# Patient Record
Sex: Female | Born: 1945 | Race: Black or African American | Hispanic: No | Smoking: Former smoker
Health system: Southern US, Community
[De-identification: ages and names within clinical notes are randomized; demographics above are authoritative.]

## PROBLEM LIST (undated history)

## (undated) DIAGNOSIS — E119 Type 2 diabetes mellitus without complications: Secondary | ICD-10-CM

## (undated) DIAGNOSIS — E785 Hyperlipidemia, unspecified: Secondary | ICD-10-CM

## (undated) DIAGNOSIS — Z789 Other specified health status: Secondary | ICD-10-CM

## (undated) DIAGNOSIS — I10 Essential (primary) hypertension: Secondary | ICD-10-CM

## (undated) DIAGNOSIS — M1990 Unspecified osteoarthritis, unspecified site: Secondary | ICD-10-CM

## (undated) DIAGNOSIS — H269 Unspecified cataract: Secondary | ICD-10-CM

## (undated) DIAGNOSIS — Z15068 Genetic susceptibility to other malignant neoplasm of digestive system: Secondary | ICD-10-CM

## (undated) DIAGNOSIS — T7840XA Allergy, unspecified, initial encounter: Secondary | ICD-10-CM

## (undated) DIAGNOSIS — C50919 Malignant neoplasm of unspecified site of unspecified female breast: Secondary | ICD-10-CM

## (undated) HISTORY — DX: Allergy, unspecified, initial encounter: T78.40XA

## (undated) HISTORY — DX: Unspecified osteoarthritis, unspecified site: M19.90

## (undated) HISTORY — DX: Malignant neoplasm of unspecified site of unspecified female breast: C50.919

## (undated) HISTORY — DX: Essential (primary) hypertension: I10

## (undated) HISTORY — DX: Hyperlipidemia, unspecified: E78.5

## (undated) HISTORY — PX: BREAST SURGERY: SHX581

## (undated) HISTORY — DX: Type 2 diabetes mellitus without complications: E11.9

## (undated) HISTORY — DX: Unspecified cataract: H26.9

## (undated) HISTORY — PX: ABDOMINAL HYSTERECTOMY: SHX81

## (undated) HISTORY — DX: Genetic susceptibility to other malignant neoplasm of digestive system: Z15.068

---

## 2004-12-15 ENCOUNTER — Emergency Department: Payer: Self-pay | Admitting: Family Medicine

## 2016-11-06 ENCOUNTER — Emergency Department: Payer: Medicare Other

## 2016-11-06 ENCOUNTER — Encounter: Payer: Self-pay | Admitting: Emergency Medicine

## 2016-11-06 ENCOUNTER — Emergency Department
Admission: EM | Admit: 2016-11-06 | Discharge: 2016-11-06 | Disposition: A | Discharge status: Home / Self Care | Payer: Medicare Other | Attending: Emergency Medicine | Admitting: Emergency Medicine

## 2016-11-06 DIAGNOSIS — Z87891 Personal history of nicotine dependence: Secondary | ICD-10-CM | POA: Insufficient documentation

## 2016-11-06 DIAGNOSIS — I10 Essential (primary) hypertension: Secondary | ICD-10-CM | POA: Diagnosis not present

## 2016-11-06 DIAGNOSIS — R079 Chest pain, unspecified: Secondary | ICD-10-CM | POA: Diagnosis present

## 2016-11-06 HISTORY — DX: Other specified health status: Z78.9

## 2016-11-06 LAB — BASIC METABOLIC PANEL
ANION GAP: 4 — AB (ref 5–15)
BUN: 9 mg/dL (ref 6–20)
CALCIUM: 9.3 mg/dL (ref 8.9–10.3)
CHLORIDE: 104 mmol/L (ref 101–111)
CO2: 29 mmol/L (ref 22–32)
Creatinine, Ser: 0.7 mg/dL (ref 0.44–1.00)
GFR calc Af Amer: >60 mL/min (ref >60)
GFR calc non Af Amer: >60 mL/min (ref >60)
Glucose, Bld: 164 mg/dL — ABNORMAL HIGH (ref 65–99)
POTASSIUM: 3.9 mmol/L (ref 3.5–5.1)
Sodium: 137 mmol/L (ref 135–145)

## 2016-11-06 LAB — CBC
HCT: 36 % (ref 35.0–47.0)
HEMOGLOBIN: 12.2 g/dL (ref 12.0–16.0)
MCH: 31.7 pg (ref 26.0–34.0)
MCHC: 33.9 g/dL (ref 32.0–36.0)
MCV: 93.3 fL (ref 80.0–100.0)
Platelets: 263 10*3/uL (ref 150–440)
RBC: 3.86 MIL/uL (ref 3.80–5.20)
RDW: 14.1 % (ref 11.5–14.5)
WBC: 8.4 10*3/uL (ref 3.6–11.0)

## 2016-11-06 LAB — TROPONIN I: Troponin I: <0.03 ng/mL (ref <0.03)

## 2016-11-06 MED ORDER — OMEPRAZOLE 40 MG PO CPDR: 40.0000 mg | DELAYED_RELEASE_CAPSULE | Freq: Every day | ORAL | 0 refills | Status: DC

## 2016-11-06 MED ORDER — METOPROLOL TARTRATE 25 MG PO TABS
25.0000 mg | ORAL_TABLET | Freq: Once | ORAL | Status: AC
  Administered 2016-11-06: 25 mg via ORAL
  Filled 2016-11-06: qty 1

## 2016-11-06 NOTE — ED Provider Notes (Signed)
[facility] Emergency Department Provider Note  ____________________________________________  Time seen: Approximately 5:00 PM  I have reviewed the triage vital signs and the nursing notes.   HISTORY  Chief Complaint Chest Pain    HPI Vicki West is a 71 y.o. female , otherwise healthy, presenting with right-sided chest pain. The patient reports that yesterday in the day before, she had some episodes of sharp chest pains below the right breast. She noticed that it was worse if she made a quick lateral movement to the right with her arm. She treated it with medication for "heartburn" and this helped; she does not remember which medication she used. Today, the patient has not had any symptoms "because I took the heartburn medicine." She denies any pleuritic component, palpitations, shortness of breath, diaphoresis, nausea or vomiting, radiation of pain, lower extremity swelling or calf pain. The patient has not seen a primary care physician for decades, and has never been told she is hypertensive although her blood pressure here is 191/93.At this time, the patient is completely symptomatic.   Past Medical History:  Diagnosis Date  . Patient denies medical problems     There are no active problems to display for this patient.   Past Surgical History:  Procedure Laterality Date  . ABDOMINAL HYSTERECTOMY        Allergies Patient has no known allergies.  History reviewed. No pertinent family history.  Social History Social History  Substance Use Topics  . Smoking status: Former Games developer  . Smokeless tobacco: Never Used  . Alcohol use Yes    Review of Systems Constitutional: No fever/chills.No lightheadedness or syncope. No diaphoresis. Eyes: No visual changes. ENT: No sore throat. No congestion or rhinorrhea. Cardiovascular: Positive right-sided chest pain. Denies palpitations. Respiratory: Denies shortness of breath.  No  cough. Gastrointestinal: No abdominal pain.  No nausea, no vomiting.  No diarrhea.  No constipation. Genitourinary: Negative for dysuria. Musculoskeletal: Negative for back pain. No lower extremity swelling. Her pain. Skin: Negative for rash. Neurological: Negative for headaches. No focal numbness, tingling or weakness. Chronic left facial droop since dental surgery remotely.  10-point ROS otherwise negative.  ____________________________________________   PHYSICAL EXAM:  VITAL SIGNS: ED Triage Vitals  Enc Vitals Group     BP 11/06/16 1647 (!) 167/90     Pulse Rate 11/06/16 1644 72     Resp 11/06/16 1644 18     Temp 11/06/16 1644 98.5 F (36.9 C)     Temp Source 11/06/16 1644 Oral     SpO2 11/06/16 1644 98 %     Weight 11/06/16 1645 140 lb (63.5 kg)     Height 11/06/16 1645 5\' 3"  (1.6 m)     Head Circumference --      Peak Flow --      Pain Score --      Pain Loc --      Pain Edu? --      Excl. in GC? --     Constitutional: Alert and oriented. Chronically ill appearing and in no acute distress. Answers questions appropriately. Eyes: Conjunctivae are normal.  EOMI. No scleral icterus. Head: Atraumatic. Nose: No congestion/rhinnorhea. Mouth/Throat: Mucous membranes are moist. Poor dentition. Neck: No stridor.  Supple.  No JVD. No meningismus. Cardiovascular: Normal rate, regular rhythm. No murmurs, rubs or gallops. No reproducible tenderness on her around the right breast. No skin changes overlying the right breast. Respiratory: Normal respiratory effort.  No accessory muscle use or retractions. Lungs CTAB.  No  wheezes, rales or ronchi. Gastrointestinal: Soft, nontender and nondistended.  Negative Murphy sign. No guarding or rebound.  No peritoneal signs. Musculoskeletal: No LE edema. No ttp in the calves or palpable cords.  Negative Homan's sign. Neurologic:  A&Ox3.  Speech is clear.  Face and smile are symmetric.  EOMI.  Moves all extremities well. Skin:  Skin is warm,  dry and intact. No rash noted. Psychiatric: Mood and affect are normal. Speech and behavior are normal.  Normal judgement.  ____________________________________________   LABS (all labs ordered are listed, but only abnormal results are displayed)  Labs Reviewed  BASIC METABOLIC PANEL - Abnormal; Notable for the following:       Result Value   Glucose, Bld 164 (*)    Anion gap 4 (*)    All other components within normal limits  CBC  TROPONIN I   ____________________________________________  EKG  ED ECG REPORT I, Rockne Menghini, the attending physician, personally viewed and interpreted this ECG.   Date: 11/06/2016  EKG Time: 1641  Rate: 73  Rhythm: normal sinus rhythm  Axis: leftward  Intervals:none  ST&T Change: no STEMI  ____________________________________________  RADIOLOGY  Dg Chest 2 View  Result Date: 11/06/2016 CLINICAL DATA:  Interim inter right-sided chest pain. EXAM: CHEST  2 VIEW COMPARISON:  Left shoulder series 7 02/2005. FINDINGS: Mediastinum and hilar structures normal. Heart size normal. Low lung volumes with mild basilar atelectasis and/or scarring. No prominent pleural effusion. No pneumothorax. No acute bony abnormality identified. Degenerative changes thoracic spine and both shoulders. Scoliosis thoracic spine concave left. IMPRESSION: 1.  Bibasilar subsegmental atelectasis and/or scarring. 2. Degenerative changes thoracic spine and both shoulders. Thoracic spine scoliosis. No acute bony abnormality. Electronically Signed   By: Maisie Fus  Register   On: 11/06/2016 17:29    ____________________________________________   PROCEDURES  Procedure(s) performed: None  Procedures  Critical Care performed: No ____________________________________________   INITIAL IMPRESSION / ASSESSMENT AND PLAN / ED COURSE  Pertinent labs & imaging results that were available during my care of the patient were reviewed by me and considered in my medical decision  making (see chart for details).  71 y.o. female with sharp right-sided chest pains without associated symptoms, possibly positional, and improving with heartburn medications. Overall, the patient is markedly hypertensive with a blood pressure 191/93 and I will treat her with an antihypertensive. The patient's pain is atypical but will get a troponin, but I am reassured by her EKG. If the patient's workup in the emergency department is reassuring, I'll plan to discharge her home with omeprazole, as this heartburn medication has been helping, as well as instructions to establish a primary care physician for her hypertension and a cardiologist for further evaluation of her chest pain. The patient has never undergone any risk stratification studies and is likely a good candidate for stress test. Plan reevaluation for final disposition.  ----------------------------------------- 6:08 PM on 11/06/2016 -----------------------------------------  The patient's workup in the emergency department is reassuring. Her EKG does not show ischemic changes, her troponin is negative, and her chest x-ray does not show any acute cardiopulmonary process. I'll plan to discharge her at this time, and she has been given appointment line information for [facility] to [facility] a primary care physician, as well as Dr. Antoine Poche, who is the cardiologist on-call for today. We discussed follow-up instructions as well as return precautions  ____________________________________________  FINAL CLINICAL IMPRESSION(S) / ED DIAGNOSES  Final diagnoses:  Right-sided chest pain  Hypertension, unspecified type  NEW MEDICATIONS STARTED DURING THIS VISIT:  New Prescriptions   OMEPRAZOLE (PRILOSEC) 40 MG CAPSULE    Take 1 capsule (40 mg total) by mouth daily.      Rockne MenghiniNorman, Anne-Caroline, MD 11/06/16 [phone]

## 2016-11-06 NOTE — Discharge Instructions (Signed)
Please make an appointment to establish a primary care physician who can reevaluate your blood pressure to see if you need to be on blood pressure medication. Your primary care doctor will also check you for any other chronic medical conditions. Please make an appointment with Dr. Antoine PocheHochrein, the cardiologist on-call, for reevaluation of your chest pain and possible stress test.  Return to the emergency department if you develop severe pain, shortness of breath, palpitations, or any other symptoms concerning to you.

## 2016-11-06 NOTE — ED Triage Notes (Signed)
Pt c/o intermittent right sided chest pain for last 2 days.  deneis SHOB, fevers, or cough.  Pain has subsided at this time.  Ambulatory to triage without difficulty.  Respirations unlabored.  NAD. VSS.  Denies medical history.

## 2017-02-20 ENCOUNTER — Encounter: Payer: Self-pay | Admitting: Cardiovascular Disease

## 2017-02-20 ENCOUNTER — Ambulatory Visit (INDEPENDENT_AMBULATORY_CARE_PROVIDER_SITE_OTHER): Payer: Medicare Other | Admitting: Cardiovascular Disease

## 2017-02-20 VITALS — BP 160/82 | HR 75 | Ht 63.0 in | Wt 159.0 lb

## 2017-02-20 DIAGNOSIS — R9431 Abnormal electrocardiogram [ECG] [EKG]: Secondary | ICD-10-CM | POA: Diagnosis not present

## 2017-02-20 DIAGNOSIS — R079 Chest pain, unspecified: Secondary | ICD-10-CM

## 2017-02-20 DIAGNOSIS — I10 Essential (primary) hypertension: Secondary | ICD-10-CM | POA: Diagnosis not present

## 2017-02-20 MED ORDER — HYDROCHLOROTHIAZIDE 25 MG PO TABS: 25.0000 mg | ORAL_TABLET | Freq: Every day | ORAL | 3 refills | Status: DC

## 2017-02-20 NOTE — Progress Notes (Signed)
Cardiology Office Note   Date:  02/20/2017   ID:  Vicki FendMartha Hunsberger, DOB 07-04-1945, MRN [id number]  PCP:  Patient, No Pcp Per  Cardiologist:   Lorine BearsMuhammad Arida, MD   Chief Complaint  Patient presents with  . other    [facility]; chest pain. Meds reviewed by the pt. verbally. Pt. denies chest pain or shortness of breath.       History of Present Illness: Vicki West is a 71 y.o. female who [facility]. She went to her on May 30 with sharp right sided chest pain. She was noted to be hypertensive on presentation with blood pressure of 191/93. Troponin was normal and EKG showed no significant changes. She was given omeprazole for suspected GERD. She has been doing well with no recurrent chest pain, shortness of breath or palpitations. She is feeling very well overall. She has not seen a primary care physician in many years. Blood pressure continues to be elevated. She never took blood pressure medications. She is not a smoker and there is no family history of coronary artery disease.    Past Medical History:  Diagnosis Date  . Patient denies medical problems     Past Surgical History:  Procedure Laterality Date  . ABDOMINAL HYSTERECTOMY       No current outpatient prescriptions on file.   No current facility-administered medications for this visit.     Allergies:   Patient has no known allergies.    Social History:  The patient  reports that she has quit smoking. Her smoking use included Cigarettes. She has a 9.00 pack-year smoking history. She has never used smokeless tobacco. She reports that she drinks alcohol. She reports that she does not use drugs.   Family History:  The patient's family history includes Hyperlipidemia in her sister.    ROS:  Please see the history of present illness.   Otherwise, review of systems are positive for none.   All other systems are reviewed and negative.    PHYSICAL EXAM: VS:   BP (!) 160/82 (BP Location: Right Arm, Patient Position: Sitting, Cuff Size: Normal)   Ht 5\' 3"  (1.6 m)   Wt 159 lb (72.1 kg)   BMI 28.17 kg/m  , BMI Body mass index is 28.17 kg/m. GEN: Well nourished, well developed, in no acute distress  HEENT: normal  Neck: no JVD, carotid bruits, or masses Cardiac: RRR; no murmurs, rubs, or gallops,no edema  Respiratory:  clear to auscultation bilaterally, normal work of breathing GI: soft, nontender, nondistended, + BS MS: no deformity or atrophy  Skin: warm and dry, no rash Neuro:  Strength and sensation are intact Psych: euthymic mood, full affect   EKG:  EKG is ordered today. The ekg ordered today demonstrates normal sinus rhythm with minimal LVH and nonspecific T wave changes.   Recent Labs: 11/06/2016: BUN 9; Creatinine, Ser 0.70; Hemoglobin 12.2; Platelets 263; Potassium 3.9; Sodium 137    Lipid Panel No results found for: CHOL, TRIG, HDL, CHOLHDL, VLDL, LDLCALC, LDLDIRECT    Wt Readings from Last 3 Encounters:  02/20/17 159 lb (72.1 kg)  11/06/16 140 lb (63.5 kg)      No flowsheet data found.    ASSESSMENT AND PLAN:  1.  Atypical chest pain likely due to GERD as her symptoms resolved after treatment with omeprazole. No recurrent chest pain and thus no indication for stress testing.  2. Abnormal EKG: She has evidence  of LVH with nonspecific T wave changes. I requested an echocardiogram for evaluation. I suspect she has underlying hypertensive heart disease.  3. Essential hypertension: I suspect that she had hypertension for a while which has not been treated. Blood pressure was extremely high during her emergency room visit in May. I discussed with her the dangerous effects of untreated hypertension. We provided her with instructions for low sodium diet and I elected to start hydrochlorothiazide 25 mg once daily. Check basic metabolic profile in one week. I strongly advised her to establish with a primary care physician and  she has seen Dr. Maryellen Pile in the past. We will try to assist her in making an appointment.    Disposition:   FU with me as needed.  Signed,  Lorine Bears, MD  02/20/2017 11:29 AM    [facility]

## 2017-02-20 NOTE — Patient Instructions (Addendum)
Medication Instructions:  Your physician has recommended you make the following change in your medication:  START taking hydrochlorothiazide  once daily   Labwork: BMET in one week at the [facility] lab. No appt necessary.  Testing/Procedures: Your physician has requested that you have an echocardiogram. Echocardiography is a painless test that uses sound waves to create images of your heart. It provides your doctor with information about the size and shape of your heart and how well your heart's chambers and valves are working. This procedure takes approximately one hour. There are no restrictions for this procedure.    Follow-Up: Your physician recommends that you schedule a follow-up appointment as needed.   Any Other Special Instructions Will Be Listed Below (If Applicable). Please call a primary care physician to establish care. As discussed, we are providing you with the phone number and address of [facility] [address] [place] [phone]       If you need a refill on your cardiac medications before your next appointment, please call your pharmacy.   Low-Sodium Eating Plan Sodium, which is an element that makes up salt, helps you maintain a healthy balance of fluids in your body. Too much sodium can increase your blood pressure and cause fluid and waste to be held in your body. Your health care provider or dietitian may recommend following this plan if you have high blood pressure (hypertension), kidney disease, liver disease, or heart failure. Eating less sodium can help lower your blood pressure, reduce swelling, and protect your heart, liver, and kidneys. What are tips for following this plan? General guidelines  Most people on this plan should limit their sodium intake to 1,500-2,000 mg (milligrams) of sodium each day. Reading food labels  The Nutrition Facts label lists the amount of sodium in one serving of the food. If you eat  more than one serving, you must multiply the listed amount of sodium by the number of servings.  Choose foods with less than 140 mg of sodium per serving.  Avoid foods with 300 mg of sodium or more per serving. Shopping  Look for lower-sodium products, often labeled as "low-sodium" or "no salt added."  Always check the sodium content even if foods are labeled as "unsalted" or "no salt added".  Buy fresh foods. ? Avoid canned foods and premade or frozen meals. ? Avoid canned, cured, or processed meats  Buy breads that have less than 80 mg of sodium per slice. Cooking  Eat more home-cooked food and less restaurant, buffet, and fast food.  Avoid adding salt when cooking. Use salt-free seasonings or herbs instead of table salt or sea salt. Check with your health care provider or pharmacist before using salt substitutes.  Cook with plant-based oils, such as canola, sunflower, or olive oil. Meal planning  When eating at a restaurant, ask that your food be prepared with less salt or no salt, if possible.  Avoid foods that contain MSG (monosodium glutamate). MSG is sometimes added to [place] food, bouillon, and some canned foods. What foods are recommended? The items listed may not be a complete list. Talk with your dietitian about what dietary choices are best for you. Grains Low-sodium cereals, including oats, puffed wheat and rice, and shredded wheat. Low-sodium crackers. Unsalted rice. Unsalted pasta. Low-sodium bread. Whole-grain breads and whole-grain pasta. Vegetables Fresh or frozen vegetables. "No salt added" canned vegetables. "No salt added" tomato sauce and paste. Low-sodium or reduced-sodium tomato and vegetable juice. Fruits Fresh, frozen, or  canned fruit. Fruit juice. Meats and other protein foods Fresh or frozen (no salt added) meat, poultry, seafood, and fish. Low-sodium canned tuna and salmon. Unsalted nuts. Dried peas, beans, and lentils without added salt. Unsalted  canned beans. Eggs. Unsalted nut butters. Dairy Milk. Soy milk. Cheese that is naturally low in sodium, such as ricotta cheese, fresh mozzarella, or Swiss cheese Low-sodium or reduced-sodium cheese. Cream cheese. Yogurt. Fats and oils Unsalted butter. Unsalted margarine with no trans fat. Vegetable oils such as canola or olive oils. Seasonings and other foods Fresh and dried herbs and spices. Salt-free seasonings. Low-sodium mustard and ketchup. Sodium-free salad dressing. Sodium-free light mayonnaise. Fresh or refrigerated horseradish. Lemon juice. Vinegar. Homemade, reduced-sodium, or low-sodium soups. Unsalted popcorn and pretzels. Low-salt or salt-free chips. What foods are not recommended? The items listed may not be a complete list. Talk with your dietitian about what dietary choices are best for you. Grains Instant hot cereals. Bread stuffing, pancake, and biscuit mixes. Croutons. Seasoned rice or pasta mixes. Noodle soup cups. Boxed or frozen macaroni and cheese. Regular salted crackers. Self-rising flour. Vegetables Sauerkraut, pickled vegetables, and relishes. Olives. [place] fries. Onion rings. Regular canned vegetables (not low-sodium or reduced-sodium). Regular canned tomato sauce and paste (not low-sodium or reduced-sodium). Regular tomato and vegetable juice (not low-sodium or reduced-sodium). Frozen vegetables in sauces. Meats and other protein foods Meat or fish that is salted, canned, smoked, spiced, or pickled. Bacon, ham, sausage, hotdogs, corned beef, chipped beef, packaged lunch meats, salt pork, jerky, pickled herring, anchovies, regular canned tuna, sardines, salted nuts. Dairy Processed cheese and cheese spreads. Cheese curds. Blue cheese. Feta cheese. String cheese. Regular cottage cheese. Buttermilk. Canned milk. Fats and oils Salted butter. Regular margarine. Ghee. Bacon fat. Seasonings and other foods Onion salt, garlic salt, seasoned salt, table salt, and sea salt.  Canned and packaged gravies. Worcestershire sauce. Tartar sauce. Barbecue sauce. Teriyaki sauce. Soy sauce, including reduced-sodium. Steak sauce. Fish sauce. Oyster sauce. Cocktail sauce. Horseradish that you find on the shelf. Regular ketchup and mustard. Meat flavorings and tenderizers. Bouillon cubes. Hot sauce and Tabasco sauce. Premade or packaged marinades. Premade or packaged taco seasonings. Relishes. Regular salad dressings. Salsa. Potato and tortilla chips. Corn chips and puffs. Salted popcorn and pretzels. Canned or dried soups. Pizza. Frozen entrees and pot pies. Summary  Eating less sodium can help lower your blood pressure, reduce swelling, and protect your heart, liver, and kidneys.  Most people on this plan should limit their sodium intake to 1,500-2,000 mg (milligrams) of sodium each day.  Canned, boxed, and frozen foods are high in sodium. Restaurant foods, fast foods, and pizza are also very high in sodium. You also get sodium by adding salt to food.  Try to cook at home, eat more fresh fruits and vegetables, and eat less fast food, canned, processed, or prepared foods. This information is not intended to replace advice given to you by your health care provider. Make sure you discuss any questions you have with your health care provider. Document Released: 11/16/2001 Document Revised: 05/20/2016 Document Reviewed: 05/20/2016 Elsevier Interactive Patient Education  2017 Elsevier Inc.  DASH Eating Plan DASH stands for "Dietary Approaches to Stop Hypertension." The DASH eating plan is a healthy eating plan that has been shown to reduce high blood pressure (hypertension). It may also reduce your risk for type 2 diabetes, heart disease, and stroke. The DASH eating plan may also help with weight loss. What are tips for following this plan? General guidelines  Avoid eating  more than 2,300 mg (milligrams) of salt (sodium) a day. If you have hypertension, you may need to reduce your  sodium intake to 1,500 mg a day.  Limit alcohol intake to no more than 1 drink a day for nonpregnant women and 2 drinks a day for men. One drink equals 12 oz of beer, 5 oz of wine, or 1 oz of hard liquor.  Work with your health care provider to maintain a healthy body weight or to lose weight. Ask what an ideal weight is for you.  Get at least 30 minutes of exercise that causes your heart to beat faster (aerobic exercise) most days of the week. Activities may include walking, swimming, or biking.  Work with your health care provider or diet and nutrition specialist (dietitian) to adjust your eating plan to your individual calorie needs. Reading food labels  Check food labels for the amount of sodium per serving. Choose foods with less than 5 percent of the Daily Value of sodium. Generally, foods with less than 300 mg of sodium per serving fit into this eating plan.  To find whole grains, look for the word "whole" as the first word in the ingredient list. Shopping  Buy products labeled as "low-sodium" or "no salt added."  Buy fresh foods. Avoid canned foods and premade or frozen meals. Cooking  Avoid adding salt when cooking. Use salt-free seasonings or herbs instead of table salt or sea salt. Check with your health care provider or pharmacist before using salt substitutes.  Do not fry foods. Cook foods using healthy methods such as baking, boiling, grilling, and broiling instead.  Cook with heart-healthy oils, such as olive, canola, soybean, or sunflower oil. Meal planning   Eat a balanced diet that includes: ? 5 or more servings of fruits and vegetables each day. At each meal, try to fill half of your plate with fruits and vegetables. ? Up to 6-8 servings of whole grains each day. ? Less than 6 oz of lean meat, poultry, or fish each day. A 3-oz serving of meat is about the same size as a deck of cards. One egg equals 1 oz. ? 2 servings of low-fat dairy each day. ? A serving of  nuts, seeds, or beans 5 times each week. ? Heart-healthy fats. Healthy fats called Omega-3 fatty acids are found in foods such as flaxseeds and coldwater fish, like sardines, salmon, and mackerel.  Limit how much you eat of the following: ? Canned or prepackaged foods. ? Food that is high in trans fat, such as fried foods. ? Food that is high in saturated fat, such as fatty meat. ? Sweets, desserts, sugary drinks, and other foods with added sugar. ? Full-fat dairy products.  Do not salt foods before eating.  Try to eat at least 2 vegetarian meals each week.  Eat more home-cooked food and less restaurant, buffet, and fast food.  When eating at a restaurant, ask that your food be prepared with less salt or no salt, if possible. What foods are recommended? The items listed may not be a complete list. Talk with your dietitian about what dietary choices are best for you. Grains Whole-grain or whole-wheat bread. Whole-grain or whole-wheat pasta. Brown rice. Orpah Cobb. Bulgur. Whole-grain and low-sodium cereals. Pita bread. Low-fat, low-sodium crackers. Whole-wheat flour tortillas. Vegetables Fresh or frozen vegetables (raw, steamed, roasted, or grilled). Low-sodium or reduced-sodium tomato and vegetable juice. Low-sodium or reduced-sodium tomato sauce and tomato paste. Low-sodium or reduced-sodium canned vegetables. Fruits All fresh,  dried, or frozen fruit. Canned fruit in natural juice (without added sugar). Meat and other protein foods Skinless chicken or [place]. Ground chicken or [place]. Pork with fat trimmed off. Fish and seafood. Egg whites. Dried beans, peas, or lentils. Unsalted nuts, nut butters, and seeds. Unsalted canned beans. Lean cuts of beef with fat trimmed off. Low-sodium, lean deli meat. Dairy Low-fat (1%) or fat-free (skim) milk. Fat-free, low-fat, or reduced-fat cheeses. Nonfat, low-sodium ricotta or cottage cheese. Low-fat or nonfat yogurt. Low-fat, low-sodium  cheese. Fats and oils Soft margarine without trans fats. Vegetable oil. Low-fat, reduced-fat, or light mayonnaise and salad dressings (reduced-sodium). Canola, safflower, olive, soybean, and sunflower oils. Avocado. Seasoning and other foods Herbs. Spices. Seasoning mixes without salt. Unsalted popcorn and pretzels. Fat-free sweets. What foods are not recommended? The items listed may not be a complete list. Talk with your dietitian about what dietary choices are best for you. Grains Baked goods made with fat, such as croissants, muffins, or some breads. Dry pasta or rice meal packs. Vegetables Creamed or fried vegetables. Vegetables in a cheese sauce. Regular canned vegetables (not low-sodium or reduced-sodium). Regular canned tomato sauce and paste (not low-sodium or reduced-sodium). Regular tomato and vegetable juice (not low-sodium or reduced-sodium). Rosita Fire. Olives. Fruits Canned fruit in a light or heavy syrup. Fried fruit. Fruit in cream or butter sauce. Meat and other protein foods Fatty cuts of meat. Ribs. Fried meat. Tomasa Blase. Sausage. Bologna and other processed lunch meats. Salami. Fatback. Hotdogs. Bratwurst. Salted nuts and seeds. Canned beans with added salt. Canned or smoked fish. Whole eggs or egg yolks. Chicken or [place] with skin. Dairy Whole or 2% milk, cream, and half-and-half. Whole or full-fat cream cheese. Whole-fat or sweetened yogurt. Full-fat cheese. Nondairy creamers. Whipped toppings. Processed cheese and cheese spreads. Fats and oils Butter. Stick margarine. Lard. Shortening. Ghee. Bacon fat. Tropical oils, such as coconut, palm kernel, or palm oil. Seasoning and other foods Salted popcorn and pretzels. Onion salt, garlic salt, seasoned salt, table salt, and sea salt. Worcestershire sauce. Tartar sauce. Barbecue sauce. Teriyaki sauce. Soy sauce, including reduced-sodium. Steak sauce. Canned and packaged gravies. Fish sauce. Oyster sauce. Cocktail sauce. Horseradish  that you find on the shelf. Ketchup. Mustard. Meat flavorings and tenderizers. Bouillon cubes. Hot sauce and Tabasco sauce. Premade or packaged marinades. Premade or packaged taco seasonings. Relishes. Regular salad dressings. Where to find more information:  National Heart, Lung, and Blood Institute: PopSteam.is  American Heart Association: www.heart.org Summary  The DASH eating plan is a healthy eating plan that has been shown to reduce high blood pressure (hypertension). It may also reduce your risk for type 2 diabetes, heart disease, and stroke.  With the DASH eating plan, you should limit salt (sodium) intake to 2,300 mg a day. If you have hypertension, you may need to reduce your sodium intake to 1,500 mg a day.  When on the DASH eating plan, aim to eat more fresh fruits and vegetables, whole grains, lean proteins, low-fat dairy, and heart-healthy fats.  Work with your health care provider or diet and nutrition specialist (dietitian) to adjust your eating plan to your individual calorie needs. This information is not intended to replace advice given to you by your health care provider. Make sure you discuss any questions you have with your health care provider. Document Released: 05/16/2011 Document Revised: 05/20/2016 Document Reviewed: 05/20/2016 Elsevier Interactive Patient Education  2017 Elsevier Inc. Echocardiogram An echocardiogram, or echocardiography, uses sound waves (ultrasound) to produce an image of your heart. The  echocardiogram is simple, painless, obtained within a short period of time, and offers valuable information to your health care provider. The images from an echocardiogram can provide information such as:  Evidence of coronary artery disease (CAD).  Heart size.  Heart muscle function.  Heart valve function.  Aneurysm detection.  Evidence of a past heart attack.  Fluid buildup around the heart.  Heart muscle thickening.  Assess heart valve  function.  Tell a health care provider about:  Any allergies you have.  All medicines you are taking, including vitamins, herbs, eye drops, creams, and over-the-counter medicines.  Any problems you or family members have had with anesthetic medicines.  Any blood disorders you have.  Any surgeries you have had.  Any medical conditions you have.  Whether you are pregnant or may be pregnant. What happens before the procedure? No special preparation is needed. Eat and drink normally. What happens during the procedure?  In order to produce an image of your heart, gel will be applied to your chest and a wand-like tool (transducer) will be moved over your chest. The gel will help transmit the sound waves from the transducer. The sound waves will harmlessly bounce off your heart to allow the heart images to be captured in real-time motion. These images will then be recorded.  You may need an IV to receive a medicine that improves the quality of the pictures. What happens after the procedure? You may return to your normal schedule including diet, activities, and medicines, unless your health care provider tells you otherwise. This information is not intended to replace advice given to you by your health care provider. Make sure you discuss any questions you have with your health care provider. Document Released: 05/24/2000 Document Revised: 01/13/2016 Document Reviewed: 02/01/2013 Elsevier Interactive Patient Education  2017 [employer]

## 2017-02-27 ENCOUNTER — Ambulatory Visit (INDEPENDENT_AMBULATORY_CARE_PROVIDER_SITE_OTHER): Payer: Medicare Other

## 2017-02-27 ENCOUNTER — Other Ambulatory Visit: Payer: Self-pay

## 2017-02-27 DIAGNOSIS — R079 Chest pain, unspecified: Secondary | ICD-10-CM | POA: Diagnosis not present

## 2017-02-27 DIAGNOSIS — R9431 Abnormal electrocardiogram [ECG] [EKG]: Secondary | ICD-10-CM | POA: Diagnosis not present

## 2017-03-04 ENCOUNTER — Other Ambulatory Visit
Admission: RE | Admit: 2017-03-04 | Discharge: 2017-03-04 | Disposition: A | Discharge status: Home / Self Care | Payer: Medicare Other | Source: Ambulatory Visit | Attending: Cardiovascular Disease | Admitting: Cardiovascular Disease

## 2017-03-04 DIAGNOSIS — R079 Chest pain, unspecified: Secondary | ICD-10-CM | POA: Insufficient documentation

## 2017-03-04 LAB — BASIC METABOLIC PANEL
Anion gap: 11 (ref 5–15)
BUN: 15 mg/dL (ref 6–20)
CHLORIDE: 95 mmol/L — AB (ref 101–111)
CO2: 27 mmol/L (ref 22–32)
Calcium: 9.5 mg/dL (ref 8.9–10.3)
Creatinine, Ser: 0.85 mg/dL (ref 0.44–1.00)
GFR calc Af Amer: >60 mL/min (ref >60)
GLUCOSE: 300 mg/dL — AB (ref 65–99)
POTASSIUM: 3.9 mmol/L (ref 3.5–5.1)
Sodium: 133 mmol/L — ABNORMAL LOW (ref 135–145)

## 2017-03-06 ENCOUNTER — Other Ambulatory Visit: Payer: Self-pay

## 2017-03-06 DIAGNOSIS — E871 Hypo-osmolality and hyponatremia: Secondary | ICD-10-CM

## 2018-08-30 IMAGING — CR DG CHEST 2V
2 series · 2 of 2 positions shown · non-contrast
Comparison: Left shoulder series [DATE].

CLINICAL DATA: Interim inter right-sided chest pain.

EXAM:
CHEST  2 VIEW

[chest pa]
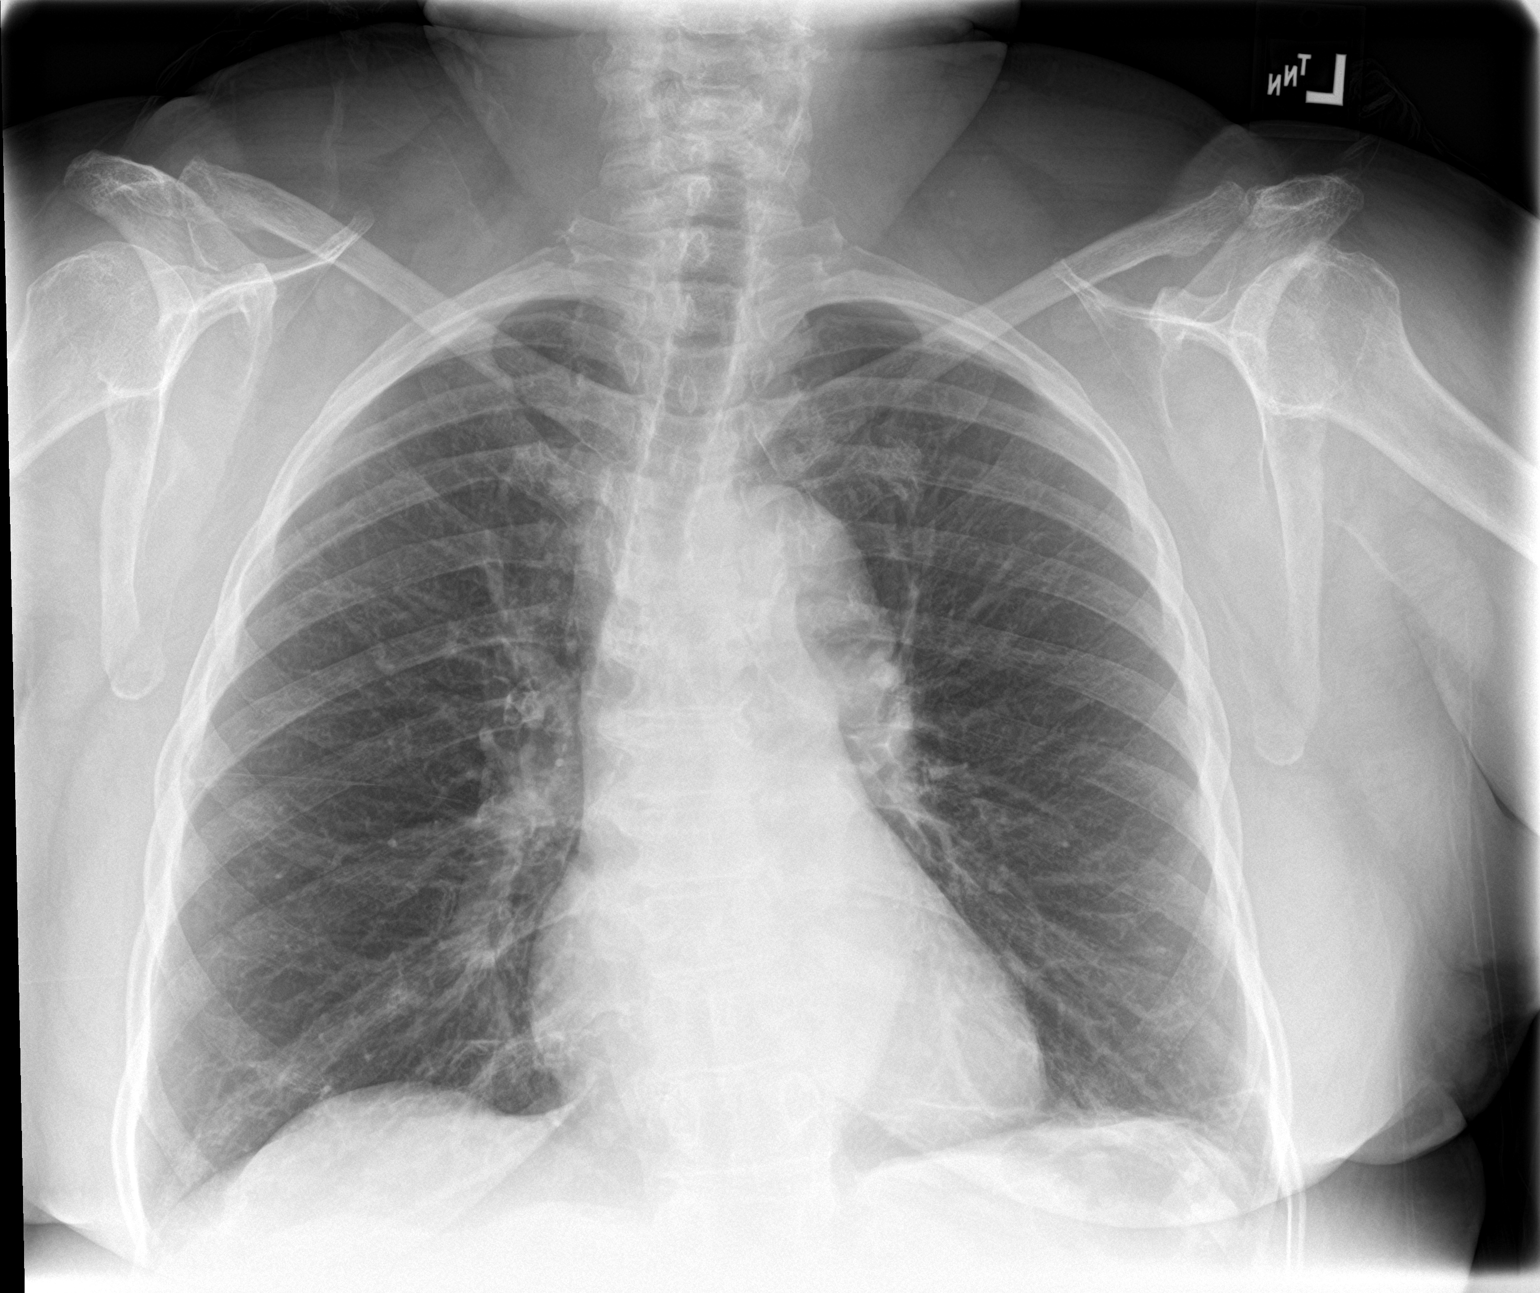

[chest lat]
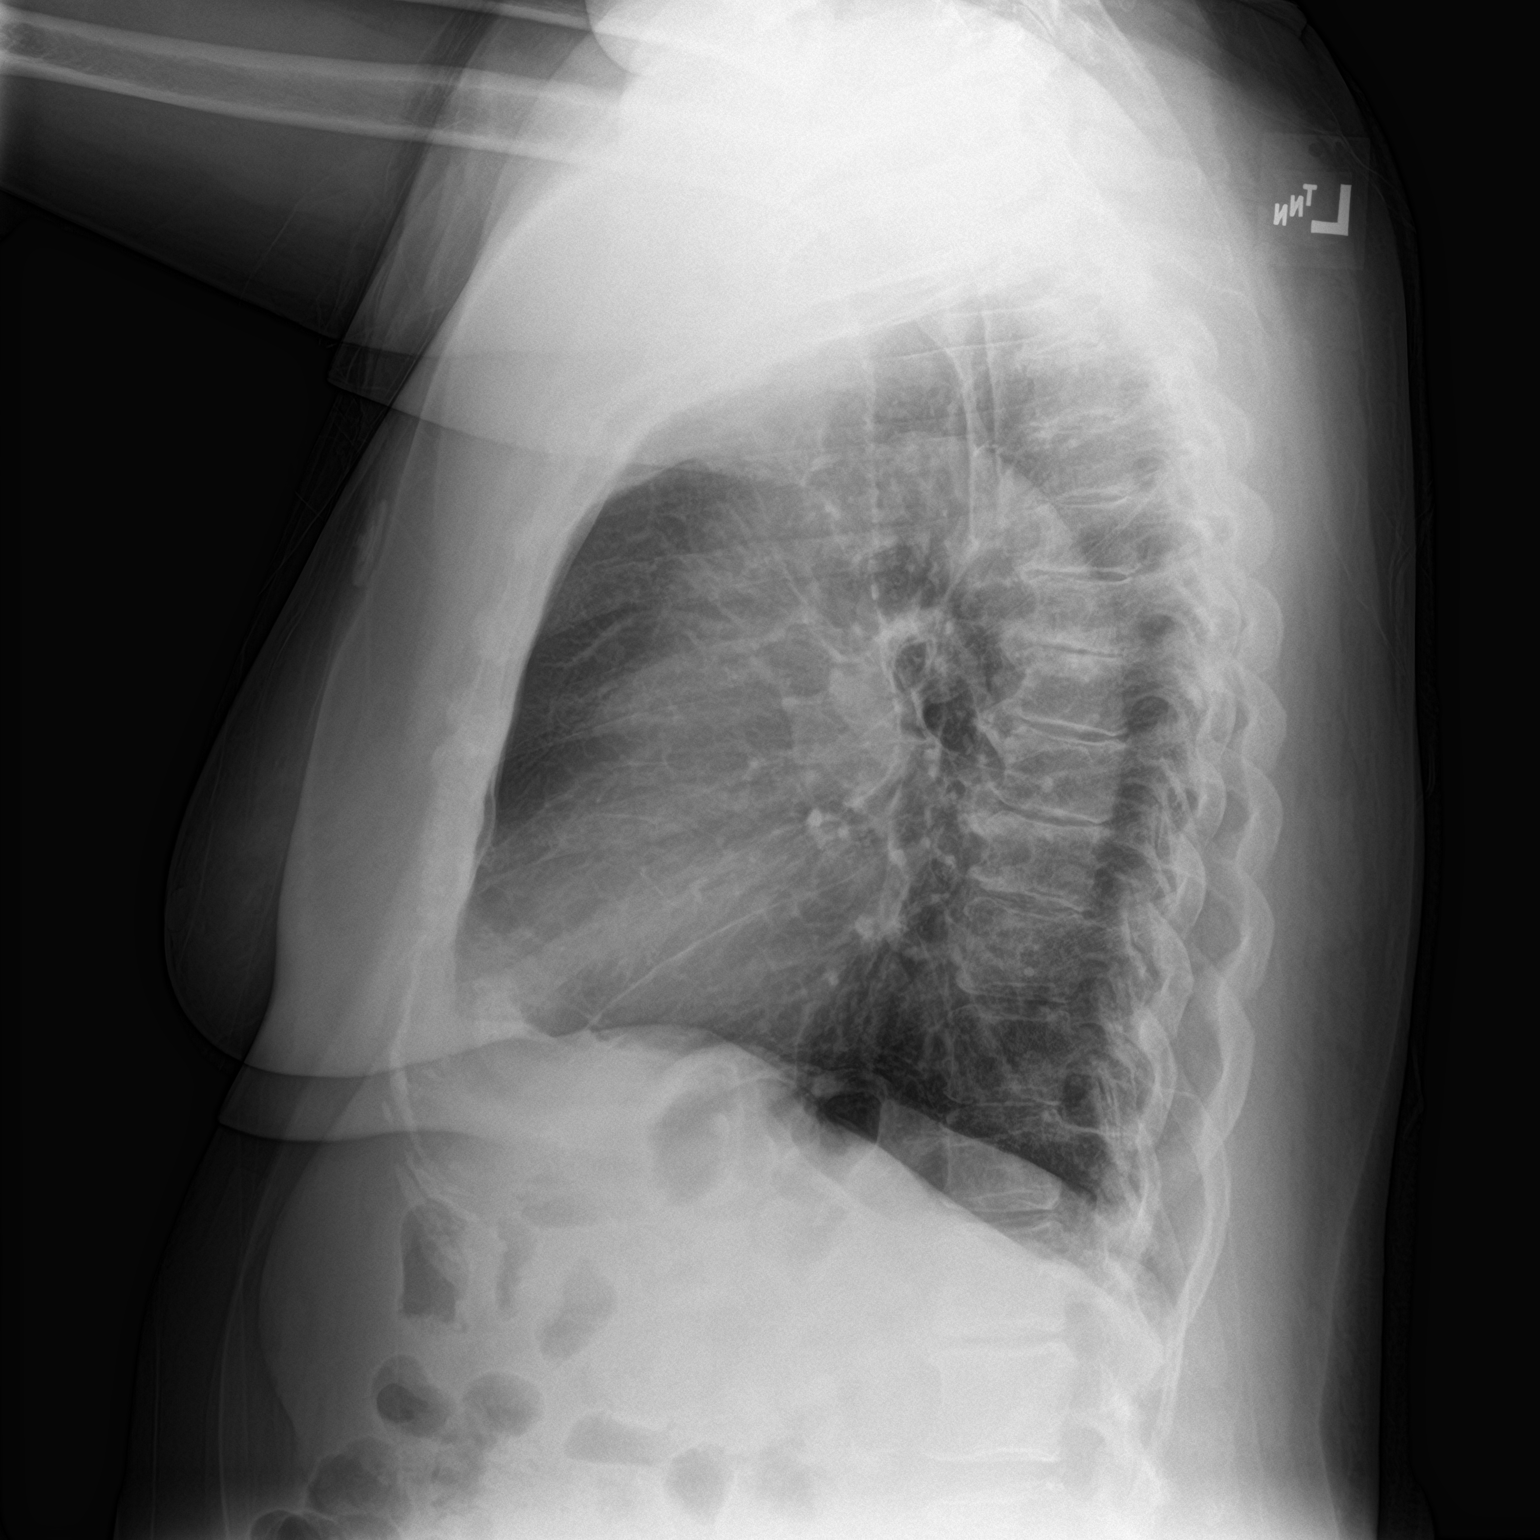

[2 of 2 positions shown; findings below may reference images not displayed]

FINDINGS: Mediastinum and hilar structures normal. Heart size normal. Low lung
volumes with mild basilar atelectasis and/or scarring. No prominent
pleural effusion. No pneumothorax. No acute bony abnormality
identified. Degenerative changes thoracic spine and both shoulders.
Scoliosis thoracic spine concave left.
IMPRESSION: 1.  Bibasilar subsegmental atelectasis and/or scarring.

2. Degenerative changes thoracic spine and both shoulders. Thoracic
spine scoliosis. No acute bony abnormality.

## 2021-12-24 ENCOUNTER — Emergency Department: Payer: Medicare Other

## 2021-12-24 ENCOUNTER — Other Ambulatory Visit: Payer: Self-pay | Admitting: Family Medicine

## 2021-12-24 ENCOUNTER — Other Ambulatory Visit: Payer: Self-pay

## 2021-12-24 ENCOUNTER — Emergency Department
Admission: EM | Admit: 2021-12-24 | Discharge: 2021-12-25 | Disposition: A | Discharge status: Home / Self Care | Payer: Medicare Other | Attending: Emergency Medicine | Admitting: Emergency Medicine

## 2021-12-24 ENCOUNTER — Encounter: Payer: Self-pay | Admitting: *Deleted

## 2021-12-24 DIAGNOSIS — N6452 Nipple discharge: Secondary | ICD-10-CM

## 2021-12-24 DIAGNOSIS — Z20822 Contact with and (suspected) exposure to covid-19: Secondary | ICD-10-CM | POA: Diagnosis not present

## 2021-12-24 DIAGNOSIS — R410 Disorientation, unspecified: Secondary | ICD-10-CM

## 2021-12-24 DIAGNOSIS — R400 Somnolence: Secondary | ICD-10-CM | POA: Insufficient documentation

## 2021-12-24 DIAGNOSIS — N6459 Other signs and symptoms in breast: Secondary | ICD-10-CM

## 2021-12-24 DIAGNOSIS — R519 Headache, unspecified: Secondary | ICD-10-CM | POA: Diagnosis not present

## 2021-12-24 DIAGNOSIS — R42 Dizziness and giddiness: Secondary | ICD-10-CM | POA: Insufficient documentation

## 2021-12-24 LAB — CBC
HCT: 40.3 % (ref 36.0–46.0)
Hemoglobin: 12.9 g/dL (ref 12.0–15.0)
MCH: 29.7 pg (ref 26.0–34.0)
MCHC: 32 g/dL (ref 30.0–36.0)
MCV: 92.9 fL (ref 80.0–100.0)
Platelets: 198 10*3/uL (ref 150–400)
RBC: 4.34 MIL/uL (ref 3.87–5.11)
RDW: 13.8 % (ref 11.5–15.5)
WBC: 8.7 10*3/uL (ref 4.0–10.5)
nRBC: 0 % (ref 0.0–0.2)

## 2021-12-24 LAB — BASIC METABOLIC PANEL
Anion gap: 8 (ref 5–15)
BUN: 14 mg/dL (ref 8–23)
CO2: 25 mmol/L (ref 22–32)
Calcium: 9.2 mg/dL (ref 8.9–10.3)
Chloride: 106 mmol/L (ref 98–111)
Creatinine, Ser: 0.68 mg/dL (ref 0.44–1.00)
GFR, Estimated: >60 mL/min (ref >60)
Glucose, Bld: 230 mg/dL — ABNORMAL HIGH (ref 70–99)
Potassium: 3.5 mmol/L (ref 3.5–5.1)
Sodium: 139 mmol/L (ref 135–145)

## 2021-12-24 LAB — SARS CORONAVIRUS 2 BY RT PCR: SARS Coronavirus 2 by RT PCR: NEGATIVE

## 2021-12-24 LAB — TROPONIN I (HIGH SENSITIVITY): Troponin I (High Sensitivity): 8 ng/L (ref <18)

## 2021-12-24 MED ORDER — SODIUM CHLORIDE 0.9 % IV BOLUS
500.0000 mL | Freq: Once | INTRAVENOUS | Status: AC
  Administered 2021-12-24: 500 mL via INTRAVENOUS

## 2021-12-24 MED ORDER — GADOBUTROL 1 MMOL/ML IV SOLN
7.0000 mL | Freq: Once | INTRAVENOUS | Status: AC | PRN
  Administered 2021-12-24: 7 mL via INTRAVENOUS

## 2021-12-24 NOTE — Discharge Instructions (Addendum)
Her blood pressure was elevated today but this needs to be rechecked by a primary care doctor to make sure that it is staying elevated because we do not want her blood pressure to go too low.  If it is continually elevated then she would need to be started on blood pressure medicine.  Follow-up with your primary care doctor as well she can take a baby aspirin daily to help prevent stroke, 81 mg.  She can call neurology to make a follow-up appointment and return to the ER if she develops weakness of one side of her body, slurred speech or any other concerns

## 2021-12-24 NOTE — ED Provider Notes (Signed)
[facility] Provider Note    Event Date/Time   First MD Initiated Contact with Patient 12/24/21 2140     (approximate)   History   Dizziness and Altered Mental Status   HPI  Vicki West is a 76 y.o. female who comes in with dizziness with a headache.  No chest pain or shortness of breath.  Symptoms started around 1500 today but patient has been sleepy all day and not acting her normal self.  Last known normal was yesterday.  They deny that she is ever done this previously.  She denies any urinary symptoms or any other concerns.  According to family she was somewhat difficult to understand but no weakness on one side of her body.  They do report that she had a little bit of bloody discharge from her left nipple and that they are working on getting her mammogram outpatient.  She did report a little bit of a headache but she reports that this is now mostly resolved.   Physical Exam   Triage Vital Signs: ED Triage Vitals  Enc Vitals Group     BP 12/24/21 1844 (!) 168/91     Pulse Rate 12/24/21 1844 (!) 106     Resp 12/24/21 1844 17     Temp 12/24/21 1844 99.1 F (37.3 C)     Temp Source 12/24/21 1844 Oral     SpO2 12/24/21 1844 94 %     Weight 12/24/21 1845 158 lb 11.7 oz (72 kg)     Height 12/24/21 1845 5\' 3"  (1.6 m)     Head Circumference --      Peak Flow --      Pain Score 12/24/21 1844 0     Pain Loc --      Pain Edu? --      Excl. in GC? --     Most recent vital signs: Vitals:   12/24/21 1844  BP: (!) 168/91  Pulse: (!) 106  Resp: 17  Temp: 99.1 F (37.3 C)  SpO2: 94%     General: Awake, no distress.  AO x2 baseline per family CV:  Good peripheral perfusion.  Resp:  Normal effort.  Abd:  No distention.  Soft and nontender Other:  Equal strength in arms and legs.  Cranial nerves appear intact.   ED Results / Procedures / Treatments   Labs (all labs ordered are listed, but only abnormal results are displayed) Labs Reviewed   BASIC METABOLIC PANEL - Abnormal; Notable for the following components:      Result Value   Glucose, Bld 230 (*)    All other components within normal limits  SARS CORONAVIRUS 2 BY RT PCR  CBC  URINALYSIS, ROUTINE W REFLEX MICROSCOPIC  TROPONIN I (HIGH SENSITIVITY)  TROPONIN I (HIGH SENSITIVITY)     EKG  My interpretation of EKG:  Normal sinus rate of 99 without any ST elevation but does have some inferior lateral T wave inversions, normal intervals  RADIOLOGY I have reviewed the xray personally and and interpreted and possible lung nodule   PROCEDURES:  Critical Care performed: No  Procedures   MEDICATIONS ORDERED IN ED: Medications  sodium chloride 0.9 % bolus 500 mL (has no administration in time range)  gadobutrol (GADAVIST) 1 MMOL/ML injection 7 mL (has no administration in time range)     IMPRESSION / MDM / ASSESSMENT AND PLAN / ED COURSE  I reviewed the triage vital signs and the nursing notes.   Patient's  presentation is most consistent with acute presentation with potential threat to life or bodily function.   Differential includes intracranial hemorrhage, stroke, COVID, UTI.  BMP elevated glucose but no anion gap elevation Covid negative Cbc normal  Trop negative  CT head negative   The increased sleepiness does not really sound like a TIA that would require admission but given the difficulty with this speaking with the sleepiness we discussed getting an MRI with and without contrast given the nipple discharge I want a make sure there is no metastatic disease and if negative family would like to take patient home.  family is comfortable having her go home and follow-up with neurology outpatient and she can take a baby aspirin until she gets follow-up.  We will also get a CT scan of her chest given the possible nodule to make sure no evidence of PE or cancer.  We will give some IV fluids to help with the tachycardia.  Has been up walking around and  reports that she feels like she is at her normal self.  Patient off to oncoming team pending repeat evaluation after this     FINAL CLINICAL IMPRESSION(S) / ED DIAGNOSES   Final diagnoses:  Sleepiness     Rx / DC Orders   ED Discharge Orders     None        Note:  This document was prepared using Dragon voice recognition software and may include unintentional dictation errors.   Concha Se, MD 12/24/21 [phone]

## 2021-12-24 NOTE — ED Triage Notes (Addendum)
Pt  reports dizziness today with a headache. No chest pain or sob.  Pt ambulated to triage without diff.  Pt confused.  Pt unable to answer questions in triage.  Son states pt slept all day and last night. .  Boyfriend of pt states sx began around 1700 today upon getting up.   Pt alert.

## 2021-12-25 ENCOUNTER — Emergency Department: Payer: Medicare Other

## 2021-12-25 DIAGNOSIS — R400 Somnolence: Secondary | ICD-10-CM | POA: Diagnosis not present

## 2021-12-25 LAB — URINALYSIS, ROUTINE W REFLEX MICROSCOPIC
Bacteria, UA: NONE SEEN
Bilirubin Urine: NEGATIVE
Glucose, UA: >=500 mg/dL — AB
Ketones, ur: NEGATIVE mg/dL
Leukocytes,Ua: NEGATIVE
Nitrite: NEGATIVE
Protein, ur: 100 mg/dL — AB
Specific Gravity, Urine: 1.036 — ABNORMAL HIGH (ref 1.005–1.030)
pH: 5 (ref 5.0–8.0)

## 2021-12-25 MED ORDER — IOHEXOL 350 MG/ML SOLN
75.0000 mL | Freq: Once | INTRAVENOUS | Status: AC | PRN
  Administered 2021-12-25: 75 mL via INTRAVENOUS

## 2021-12-25 NOTE — ED Provider Notes (Signed)
Patient received in signout from Dr. Fuller Plan pending CTA chest and MRI brain for evaluation of a resolved episode of feeling sleepy.  I reviewed his results and they are generally reassuring.  MRI without evidence of mass or CVA.  CTA chest without evidence of PE or acute cardiopulmonary pathology.  I recent the patient and she reports feeling fine and much better now.  Son acknowledges that she seems to be at her baseline.  We discussed reassuring work-up, we discussed incidental findings on CTA chest.  Per original plan of care, we will discharge with close return precautions and PCP follow-up.   Vicki [place], MD 12/25/21 [phone]

## 2022-01-31 ENCOUNTER — Ambulatory Visit
Admission: RE | Admit: 2022-01-31 | Discharge: 2022-01-31 | Disposition: A | Discharge status: Home / Self Care | Payer: Medicare Other | Source: Ambulatory Visit | Attending: Family Medicine | Admitting: Family Medicine

## 2022-01-31 DIAGNOSIS — N6459 Other signs and symptoms in breast: Secondary | ICD-10-CM

## 2022-01-31 DIAGNOSIS — N6452 Nipple discharge: Secondary | ICD-10-CM

## 2022-02-05 ENCOUNTER — Other Ambulatory Visit: Payer: Self-pay | Admitting: Family Medicine

## 2022-02-05 DIAGNOSIS — R928 Other abnormal and inconclusive findings on diagnostic imaging of breast: Secondary | ICD-10-CM

## 2022-02-05 DIAGNOSIS — N63 Unspecified lump in unspecified breast: Secondary | ICD-10-CM

## 2022-02-19 ENCOUNTER — Ambulatory Visit
Admission: RE | Admit: 2022-02-19 | Discharge: 2022-02-19 | Disposition: A | Discharge status: Home / Self Care | Payer: Medicare Other | Source: Ambulatory Visit | Attending: Family Medicine | Admitting: Family Medicine

## 2022-02-19 ENCOUNTER — Other Ambulatory Visit: Payer: Self-pay | Admitting: Family Medicine

## 2022-02-19 DIAGNOSIS — N63 Unspecified lump in unspecified breast: Secondary | ICD-10-CM

## 2022-02-19 DIAGNOSIS — R928 Other abnormal and inconclusive findings on diagnostic imaging of breast: Secondary | ICD-10-CM

## 2022-02-20 ENCOUNTER — Encounter: Payer: Self-pay | Admitting: *Deleted

## 2022-02-20 LAB — SURGICAL PATHOLOGY

## 2022-02-20 LAB — CYTOLOGY - NON PAP

## 2022-02-20 NOTE — Progress Notes (Signed)
Received referral for newly diagnosed breast cancer from [facility].  Navigation initiated.  Names of surgeons given to patient, she wants to discuss with her son.  I will call her back tomorrow afternoon to see where she would like to be seen.

## 2022-02-22 ENCOUNTER — Encounter: Payer: Self-pay | Admitting: *Deleted

## 2022-02-22 NOTE — Progress Notes (Signed)
Ms. Gasca would like to be referred to [facility].   Faxed biopsy results and her request to Dr. Stasia Cavalier office at [facility].  Also gave her the phone number to call the office and let them know her request.

## 2022-02-25 ENCOUNTER — Encounter: Payer: Self-pay | Admitting: *Deleted

## 2022-02-25 NOTE — Progress Notes (Signed)
Called Dr. Jenny Reichmann Johnston's office at [facility] to let them know about patient wishes for referral to [facility] for her breast cancer.   Gave my office phone number if they have any questions.

## 2022-03-11 DIAGNOSIS — C50912 Malignant neoplasm of unspecified site of left female breast: Secondary | ICD-10-CM | POA: Insufficient documentation

## 2022-10-09 ENCOUNTER — Telehealth: Payer: Self-pay | Admitting: Physician Assistant

## 2022-10-10 ENCOUNTER — Ambulatory Visit (INDEPENDENT_AMBULATORY_CARE_PROVIDER_SITE_OTHER): Payer: 59 | Admitting: Family Medicine

## 2022-10-10 ENCOUNTER — Encounter: Payer: Self-pay | Admitting: Family Medicine

## 2022-10-10 VITALS — BP 168/83 | HR 80 | Temp 98.6°F | Ht 63.0 in | Wt 161.0 lb

## 2022-10-10 DIAGNOSIS — E1122 Type 2 diabetes mellitus with diabetic chronic kidney disease: Secondary | ICD-10-CM | POA: Insufficient documentation

## 2022-10-10 DIAGNOSIS — E1159 Type 2 diabetes mellitus with other circulatory complications: Secondary | ICD-10-CM | POA: Diagnosis not present

## 2022-10-10 DIAGNOSIS — I152 Hypertension secondary to endocrine disorders: Secondary | ICD-10-CM

## 2022-10-10 DIAGNOSIS — J302 Other seasonal allergic rhinitis: Secondary | ICD-10-CM

## 2022-10-10 DIAGNOSIS — E1165 Type 2 diabetes mellitus with hyperglycemia: Secondary | ICD-10-CM

## 2022-10-10 DIAGNOSIS — Z23 Encounter for immunization: Secondary | ICD-10-CM

## 2022-10-10 DIAGNOSIS — C50912 Malignant neoplasm of unspecified site of left female breast: Secondary | ICD-10-CM

## 2022-10-10 DIAGNOSIS — R011 Cardiac murmur, unspecified: Secondary | ICD-10-CM

## 2022-10-10 MED ORDER — BLOOD GLUCOSE MONITORING SUPPL DEVI: 1.0000 | Freq: Three times a day (TID) | 0 refills | Status: AC

## 2022-10-10 MED ORDER — BLOOD GLUCOSE TEST VI STRP: 1.0000 | ORAL_STRIP | Freq: Every day | 11 refills | Status: AC

## 2022-10-10 MED ORDER — LANCETS MISC. MISC: 1.0000 | Freq: Three times a day (TID) | 0 refills | Status: AC

## 2022-10-10 MED ORDER — LANCET DEVICE MISC: 1.0000 | Freq: Every day | 11 refills | Status: AC

## 2022-10-10 NOTE — Assessment & Plan Note (Signed)
Increased patient's lisinopril from 5 mg to 10 mg, with patient to take 2 tablets of her current dosage for the upcoming week.  She and her daughter-in-law will then return with a blood pressure log for re-evaluation.

## 2022-10-10 NOTE — Assessment & Plan Note (Signed)
It is too early to repeat the patient's A1c.  Will recheck in 1 week when she returns for a follow-up visit.  Ordered glucometer kit as patient endorses not having one and thus has not been checking her blood sugars.

## 2022-10-10 NOTE — Progress Notes (Signed)
New patient visit   Patient: Vicki West   DOB: [birth year]   77 y.o. Female  MRN: [id number] Visit Date: 10/10/2022  Today's healthcare provider: Sherlyn Hay, DO   Chief Complaint  Patient presents with   Establish Care   Subjective    Vicki West is a 77 y.o. female who presents today as a new patient to establish care.  HPI  Patient presents with her daughter-in-law, who provided much of the HPI. She reports history of hypertension, diabetes, and breast cancer status post lumpectomy.  Last A1C on 07/17/22 was 9.3.   Her blood pressure has been elevated on the last few visits with her previous provider but no adjustments have been made on her medication.    She last saw doctor Dr. Laural Benes at [facility] in February 2024 x3 visits over the past year  She hadn't been seeing a PCP prior to that.  She has gotten the Covid and flu vaccines but not shingrix or the pneumonia vaccine. They will contact oncology regarding these for their approval given her recent cancer.   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  Endorses having a mild cough for the past 3 years.  Patient has only been on lisinopril for the last year or so.  Her cough seems to become worse every spring.  She previously went through a full bottle of claritin without relief.  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)    Diabetes mellitus without complication (HCC)    Patient denies medical problems    Past Surgical History:  Procedure Laterality Date   ABDOMINAL HYSTERECTOMY     BREAST SURGERY     Family Status  Relation Name Status   Mother  Deceased   Father  Deceased   Sister  Alive   Son  (Not Specified)   Family History  Problem Relation Age of Onset   Hyperlipidemia Sister    Hypertension Son    Hyperlipidemia Son    Diabetes Son    Social History    Socioeconomic History   Marital status: Divorced    Spouse name: Not on file   Number of children: 4   Years of education: Not on file   Highest education level: 8th grade  Occupational History   Not on file  Tobacco Use   Smoking status: Former    Packs/day: 1.50    Years: 6.00    Additional pack years: 0.00    Total pack years: 9.00    Types: Cigarettes    Quit date: 06/10/1978    Years since quitting: 44.3   Smokeless tobacco: Never  Vaping Use   Vaping Use: Never used  Substance and Sexual Activity   Alcohol use: Yes    Alcohol/week: 1.0 standard drink of alcohol    Types: 1 Cans of beer per week   Drug use: No   Sexual activity: Not Currently  Other Topics Concern   Not on file  Social History Narrative   Not on file   Social Determinants of Health   Financial Resource Strain: Not on file  Food Insecurity: No Food Insecurity (10/10/2022)   Hunger Vital Sign    Worried About Running Out of Food in the Last Year: Never true    Ran Out of Food in the Last Year: Never true  Transportation Needs:  No Transportation Needs (10/10/2022)   PRAPARE - Administrator, Civil Service (Medical): No    Lack of Transportation (Non-Medical): No  Physical Activity: Not on file  Stress: Not on file  Social Connections: Not on file   Outpatient Medications Prior to Visit  Medication Sig   anastrozole (ARIMIDEX) 1 MG tablet Take 1 mg by mouth daily.   calcium carbonate (TUMS - DOSED IN MG ELEMENTAL CALCIUM) 500 MG chewable tablet Chew 1 tablet by mouth daily.   glipiZIDE (GLUCOTROL XL) 5 MG 24 hr tablet Take 5 mg by mouth 2 (two) times daily.   VITAMIN D, CHOLECALCIFEROL, PO Take by mouth.   [DISCONTINUED] lisinopril (ZESTRIL) 5 MG tablet Take 5 mg by mouth daily.   hydrochlorothiazide (HYDRODIURIL) 25 MG tablet Take 1 tablet (25 mg total) by mouth daily.   No facility-administered medications prior to visit.   No Known Allergies   There is no immunization history  on file for this patient.  Health Maintenance  Topic Date Due   HEMOGLOBIN A1C  Never done   Medicare Annual Wellness (AWV)  Never done   COVID-19 Vaccine (1) Never done   OPHTHALMOLOGY EXAM  Never done   Diabetic kidney evaluation - Urine ACR  Never done   Hepatitis C Screening  Never done   DTaP/Tdap/Td (1 - Tdap) Never done   Zoster Vaccines- Shingrix (1 of 2) Never done   Pneumonia Vaccine 37+ Years old (1 of 1 - PCV) Never done   DEXA SCAN  Never done   Diabetic kidney evaluation - eGFR measurement  12/25/2022   INFLUENZA VACCINE  01/09/2023   FOOT EXAM  10/10/2023   HPV VACCINES  Aged Out    Patient Care Team: Patient, No Pcp Per as PCP - General (General Practice) Hulen Luster, RN as Oncology Nurse Navigator  Review of Systems  Constitutional: Negative.   HENT: Negative.    Eyes:  Positive for itching.  Respiratory:  Positive for cough and wheezing.        Wheezing at end of coughin per pt's daughter-in-law  Cardiovascular:  Positive for leg swelling.  Gastrointestinal:  Negative for abdominal pain.  Endocrine: Negative.   Genitourinary: Negative.   Musculoskeletal: Negative.   Skin: Negative.   Allergic/Immunologic: Positive for environmental allergies.  Neurological: Negative.  Negative for dizziness and weakness.  Hematological: Negative.   Psychiatric/Behavioral: Negative.         Objective    BP (!) 168/83 (BP Location: Left Arm, Patient Position: Sitting, Cuff Size: Normal)   Pulse 80   Temp 98.6 F (37 C) (Oral)   Ht 5\' 3"  (1.6 m)   Wt 161 lb (73 kg)   SpO2 98%   BMI 28.52 kg/m  Vitals:   10/10/22 1041 10/10/22 1051  BP: (!) 173/89 (!) 168/83  Pulse: 80   Temp: 98.6 F (37 C)   TempSrc: Oral   SpO2: 98%   Weight: 161 lb (73 kg)   Height: 5\' 3"  (1.6 m)      Physical Exam Vitals reviewed.  Constitutional:      General: She is not in acute distress.    Appearance: Normal appearance. She is well-developed. She is not diaphoretic.   HENT:     Head: Normocephalic and atraumatic.     Mouth/Throat:     Pharynx: No oropharyngeal exudate.  Eyes:     General: No scleral icterus.    Conjunctiva/sclera: Conjunctivae normal.  Neck:  Thyroid: No thyromegaly.  Cardiovascular:     Rate and Rhythm: Normal rate and regular rhythm.     Pulses: Normal pulses.          Dorsalis pedis pulses are 2+ on the right side and 2+ on the left side.       Posterior tibial pulses are 2+ on the right side and 2+ on the left side.     Heart sounds: Murmur heard.     Comments: Crescendo-decrescendo murmur Pulmonary:     Effort: Pulmonary effort is normal. No respiratory distress.     Breath sounds: Normal breath sounds. No wheezing or rales.  Abdominal:     General: Bowel sounds are normal. There is no distension.     Palpations: Abdomen is soft.     Tenderness: There is no abdominal tenderness.  Musculoskeletal:        General: No deformity.     Cervical back: Neck supple.     Right lower leg: Edema present.     Left lower leg: Edema present.     Comments: Mild, +1 edema bilaterally  Feet:     Right foot:     Protective Sensation: 6 sites tested.  6 sites sensed.     Toenail Condition: Right toenails are abnormally thick and long.     Left foot:     Protective Sensation: 6 sites tested.  6 sites sensed.     Toenail Condition: Left toenails are abnormally thick and long.  Lymphadenopathy:     Cervical: No cervical adenopathy.  Skin:    General: Skin is warm and dry.     Findings: No rash.  Neurological:     Mental Status: She is alert and oriented to person, place, and time. Mental status is at baseline.     Gait: Gait normal.  Psychiatric:        Mood and Affect: Mood normal.        Behavior: Behavior normal.     Depression Screen    10/10/2022   10:49 AM  PHQ 2/9 Scores  PHQ - 2 Score 0  PHQ- 9 Score 0   No results found for any visits on 10/10/22.  Assessment & Plan      Problem List Items Addressed This  Visit     Type 2 diabetes mellitus with hyperglycemia (HCC) (Chronic)    It is too early to repeat the patient's A1c.  Will recheck in 1 week when she returns for a follow-up visit.  Ordered glucometer kit as patient endorses not having one and thus has not been checking her blood sugars.      Relevant Medications   glipiZIDE (GLUCOTROL XL) 5 MG 24 hr tablet   Blood Glucose Monitoring Suppl DEVI   Glucose Blood (BLOOD GLUCOSE TEST STRIPS) STRP   Lancet Device MISC   Lancets Misc. MISC   Other Relevant Orders   Comprehensive metabolic panel   Lipid Profile   HgB A1c   Hepatitis C antibody   Urine Microalbumin w/creat. ratio   Hypertension associated with type 2 diabetes mellitus (HCC) - Primary    Increased patient's lisinopril from 5 mg to 10 mg, with patient to take 2 tablets of her current dosage for the upcoming week.  She and her daughter-in-law will then return with a blood pressure log for re-evaluation.      Relevant Medications   glipiZIDE (GLUCOTROL XL) 5 MG 24 hr tablet   Other Relevant Orders   Comprehensive metabolic  panel   Lipid Profile   Urine Microalbumin w/creat. ratio   Seasonal allergies    Given the predictable worsening of the patient's cough in the spring as well as her eyes itching, it is likely they stem from seasonal allergies.  The patient's insurance does not cover cetirizine, so, the patient's daughter-in-law endorses that she will stop and pick up a bottle of cetirizine in the next day or two.  Will reevaluate at next visit.      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 Will defer to her oncology team for management of this.      Relevant Medications   anastrozole (ARIMIDEX) 1 MG tablet   Other Visit Diagnoses     Immunization due       Murmur, cardiac       Relevant Orders   ECHOCARDIOGRAM COMPLETE     Immunization due - Shingrix and Prevnar-20 vaccines not  administered today as the patient's daughter-in-law will be checking with the patient's oncologist to ensure the patient is okay to receive them, given her recent breast cancer and chemotherapy.  Murmur, cardiac - murmur noted during physical exam concerning for possible aortic stenosis; patient and her family deny history/awareness of murmur. Ordered echocardiogram as noted.   Return in about 1 week (around 10/17/2022) for BP follow-up/labs.     The entirety of the information documented in the History of Present Illness, Review of Systems and Physical Exam were personally obtained by me. Portions of this information were initially documented by the CMA, Adline Peals, and reviewed by me for thoroughness and accuracy.     Sherlyn Hay, DO  [facility] [phone] (phone) [phone] (fax)  [facility]

## 2022-10-10 NOTE — Assessment & Plan Note (Signed)
Given the predictable worsening of the patient's cough in the spring as well as her eyes itching, it is likely they stem from seasonal allergies.  The patient's insurance does not cover cetirizine, so, the patient's daughter-in-law endorses that she will stop and pick up a bottle of cetirizine in the next day or two.  Will reevaluate at next visit.

## 2022-10-11 NOTE — Assessment & Plan Note (Addendum)
Patient continues to be followed by oncology and is on anastrozole. Will defer to her oncology team for management of this.

## 2022-10-17 ENCOUNTER — Encounter: Payer: Self-pay | Admitting: Family Medicine

## 2022-10-17 ENCOUNTER — Ambulatory Visit (INDEPENDENT_AMBULATORY_CARE_PROVIDER_SITE_OTHER): Payer: 59 | Admitting: Family Medicine

## 2022-10-17 VITALS — BP 159/82 | HR 75 | Temp 98.2°F | Wt 163.0 lb

## 2022-10-17 DIAGNOSIS — Z8639 Personal history of other endocrine, nutritional and metabolic disease: Secondary | ICD-10-CM

## 2022-10-17 DIAGNOSIS — E1159 Type 2 diabetes mellitus with other circulatory complications: Secondary | ICD-10-CM

## 2022-10-17 DIAGNOSIS — Z23 Encounter for immunization: Secondary | ICD-10-CM

## 2022-10-17 DIAGNOSIS — E1165 Type 2 diabetes mellitus with hyperglycemia: Secondary | ICD-10-CM

## 2022-10-17 DIAGNOSIS — J9809 Other diseases of bronchus, not elsewhere classified: Secondary | ICD-10-CM | POA: Diagnosis not present

## 2022-10-17 DIAGNOSIS — I152 Hypertension secondary to endocrine disorders: Secondary | ICD-10-CM

## 2022-10-17 HISTORY — DX: Other diseases of bronchus, not elsewhere classified: J98.09

## 2022-10-17 MED ORDER — ALBUTEROL SULFATE HFA 108 (90 BASE) MCG/ACT IN AERS: 1.0000 | INHALATION_SPRAY | Freq: Four times a day (QID) | RESPIRATORY_TRACT | 1 refills | Status: DC | PRN

## 2022-10-17 MED ORDER — SPACER/AERO-HOLDING CHAMBERS DEVI: 0 refills | Status: DC

## 2022-10-17 MED ORDER — LISINOPRIL 20 MG PO TABS
20.0000 mg | ORAL_TABLET | Freq: Every day | ORAL | 0 refills | Status: DC
Start: 2022-10-17 — End: 2022-10-23

## 2022-10-17 NOTE — Assessment & Plan Note (Signed)
Patient has been experiencing episodes of bronchospasm with cough and wheezing multiple times per day over the past (approximately) three years. Will prescribe albuterol with a spacer as noted below.

## 2022-10-17 NOTE — Assessment & Plan Note (Addendum)
Patient has not been checking her blood sugars as they do not yet have the glucometer. They will be going to the pharmacy after today's appointment. She will be having her blood drawn for an A1c today.

## 2022-10-17 NOTE — Progress Notes (Signed)
Established patient visit   Patient: Vicki West   DOB: Jan 16, 1946   77 y.o. Female  MRN: [id number] Visit Date: 10/17/2022  Today's healthcare provider: Sherlyn Hay, DO   No chief complaint on file.  Subjective    HPI  Diabetes Mellitus Type II, Follow-up  No results found for: "HGBA1C" Wt Readings from Last 3 Encounters:  10/17/22 163 lb (73.9 kg)  10/10/22 161 lb (73 kg)  12/24/21 158 lb 11.7 oz (72 kg)   Last seen for diabetes 1 weeks ago.  Management since then includes provided glucometer for patient and asked to record readings. She reports good compliance with treatment. She is not having side effects.  Symptoms: No fatigue No foot ulcerations  No appetite changes No nausea  No paresthesia of the feet  No polydipsia  No polyuria No visual disturbances   No vomiting     Home blood sugar records:  are not being checked, they have not yet gotten the glucometer  Episodes of hypoglycemia? No    Current insulin regiment: NA Most Recent Eye Exam:  patient is due Current exercise: none and exercises a little Current diet habits: tries to watch what she eats  Pertinent Labs: No results found for: "CHOL", "HDL", "LDLCALC", "LDLDIRECT", "TRIG", "CHOLHDL" Lab Results  Component Value Date   NA 139 12/24/2021   K 3.5 12/24/2021   CREATININE 0.68 12/24/2021   GFRNONAA >60 12/24/2021     --------------------------------------------------------------------------------------------------- Hypertension, follow-up  BP Readings from Last 3 Encounters:  10/17/22 (!) 159/82  10/10/22 (!) 168/83  12/25/21 (!) 174/97   Wt Readings from Last 3 Encounters:  10/17/22 163 lb (73.9 kg)  10/10/22 161 lb (73 kg)  12/24/21 158 lb 11.7 oz (72 kg)     She was last seen for hypertension 1 weeks ago.  BP at that visit was as above. Management since that visit includes increased Lisinopril to 10 mg daily and asked patient to record home readings..  She reports good  compliance with treatment. She is not having side effects.  She is following a Regular diet. She does not smoke.  Use of agents associated with hypertension: none.   Outside blood pressures are 142-160 over 88-92. Symptoms: No chest pain No chest pressure  No palpitations No syncope  No dyspnea No orthopnea  No paroxysmal nocturnal dyspnea No lower extremity edema    Dry cough with end-expiratory wheezing  Pertinent labs No results found for: "CHOL", "HDL", "LDLCALC", "LDLDIRECT", "TRIG", "CHOLHDL" Lab Results  Component Value Date   NA 139 12/24/2021   K 3.5 12/24/2021   CREATININE 0.68 12/24/2021   GFRNONAA >60 12/24/2021   GLUCOSE 230 (H) 12/24/2021     The ASCVD Risk score (Arnett DK, et al., 2019) failed to calculate for the following reasons:   Cannot find a previous HDL lab   Cannot find a previous total cholesterol lab  ---------------------------------------------------------------------------------------------------   Medications: Outpatient Medications Prior to Visit  Medication Sig   anastrozole (ARIMIDEX) 1 MG tablet Take 1 mg by mouth daily.   Blood Glucose Monitoring Suppl DEVI 1 each by Does not apply route in the morning, at noon, and at bedtime. May substitute to any manufacturer covered by patient's insurance.   calcium carbonate (TUMS - DOSED IN MG ELEMENTAL CALCIUM) 500 MG chewable tablet Chew 1 tablet by mouth daily.   glipiZIDE (GLUCOTROL XL) 5 MG 24 hr tablet Take 5 mg by mouth 2 (two) times daily.   Glucose Blood (  BLOOD GLUCOSE TEST STRIPS) STRP 1 each by In Vitro route daily before breakfast. May substitute to any manufacturer covered by patient's insurance.   Lancet Device MISC 1 each by Does not apply route daily before breakfast. May substitute to any manufacturer covered by patient's insurance.   Lancets Misc. MISC 1 each by Does not apply route in the morning, at noon, and at bedtime. May substitute to any manufacturer covered by patient's  insurance.   VITAMIN D, CHOLECALCIFEROL, PO Take by mouth.   hydrochlorothiazide (HYDRODIURIL) 25 MG tablet Take 1 tablet (25 mg total) by mouth daily.   No facility-administered medications prior to visit.    Review of Systems  Constitutional:  Negative for appetite change, chills and fever.  HENT:  Negative for congestion.   Respiratory:  Positive for cough and wheezing. Negative for shortness of breath.        Wheezing at end-expiration with coughing episode only  Cardiovascular:  Negative for chest pain and leg swelling.  Gastrointestinal:  Negative for abdominal pain, constipation and diarrhea.  Genitourinary:  Negative for dysuria.  Musculoskeletal:  Negative for arthralgias.  Skin:  Negative for color change and rash.  Neurological:  Negative for dizziness, numbness and headaches.  Psychiatric/Behavioral:  Negative for agitation, behavioral problems and confusion.         Objective    BP (!) 159/82 (BP Location: Left Arm, Patient Position: Sitting, Cuff Size: Normal)   Pulse 75   Temp 98.2 F (36.8 C) (Oral)   Wt 163 lb (73.9 kg)   SpO2 99%   BMI 28.87 kg/m    Physical Exam Vitals reviewed.  Constitutional:      General: She is not in acute distress.    Appearance: Normal appearance. She is well-developed. She is not diaphoretic.  HENT:     Head: Normocephalic and atraumatic.  Eyes:     General: No scleral icterus.    Conjunctiva/sclera: Conjunctivae normal.  Neck:     Thyroid: No thyromegaly.  Cardiovascular:     Rate and Rhythm: Normal rate and regular rhythm.     Pulses: Normal pulses.     Heart sounds: Murmur heard.     Comments: Right sternocostal border at 2nd ICS Pulmonary:     Effort: Pulmonary effort is normal. No respiratory distress.     Breath sounds: Normal breath sounds. No wheezing, rhonchi or rales.  Musculoskeletal:     Right lower leg: No edema.     Left lower leg: No edema.  Skin:    General: Skin is warm and dry.     Findings: No  rash.  Neurological:     Mental Status: She is alert and oriented to person, place, and time. Mental status is at baseline.  Psychiatric:        Mood and Affect: Mood normal.        Behavior: Behavior normal.      No results found for any visits on 10/17/22.  Assessment & Plan     Problem List Items Addressed This Visit     Type 2 diabetes mellitus with hyperglycemia (HCC) (Chronic)    Patient has not been checking her blood sugars as they do not yet have the glucometer. They will be going to the pharmacy after today's appointment. She will be having her blood drawn for an A1c today.      Relevant Medications   lisinopril (ZESTRIL) 20 MG tablet   Hypertension associated with type 2 diabetes mellitus (HCC) - Primary  Patient's blood pressure continues to be elevated.  Will go ahead and increase the patient's lisinopril to 20 mg daily and send this to the pharmacy.  Discussed with the patient and her daughter-in-law that she will likely need an additional medication change.  The patient's daughter-in-law will be sending me the patient's home blood pressures on Thursday, and, barring the patient's blood pressure being within an acceptable range at that point, I will plan to send in lisinopril/HCTZ 20-12.5 daily in 1 week. She will be having her blood drawn for her CMP and lipid panel today.      Relevant Medications   lisinopril (ZESTRIL) 20 MG tablet   Recurrent bronchospasm    Patient has been experiencing episodes of bronchospasm with cough and wheezing multiple times per day over the past (approximately) three years. Will prescribe albuterol with a spacer as noted below.      Relevant Medications   albuterol (VENTOLIN HFA) 108 (90 Base) MCG/ACT inhaler   Spacer/Aero-Holding Rudean Curt   Other Visit Diagnoses     History of vitamin D deficiency       Relevant Orders   Vitamin D (25 hydroxy)   Immunization due         Patient's oncologist said she was ok to receive the  shingles vaccine, but the patient declined at this time. She will be getting the administration dates for her most recent Covid and Tdap shots and bring them to her next appointment.   No follow-ups on file.      The entirety of the information documented in the History of Present Illness, Review of Systems and Physical Exam were personally obtained by me. Portions of this information were initially documented by the CMA, Adline Peals, and reviewed by me for thoroughness and accuracy.     Sherlyn Hay, DO  [facility] [phone] (phone) [phone] (fax)  [facility]

## 2022-10-17 NOTE — Assessment & Plan Note (Addendum)
Patient's blood pressure continues to be elevated.  Will go ahead and increase the patient's lisinopril to 20 mg daily and send this to the pharmacy.  Discussed with the patient and her daughter-in-law that she will likely need an additional medication change.  The patient's daughter-in-law will be sending me the patient's home blood pressures on Thursday, and, barring the patient's blood pressure being within an acceptable range at that point, I will plan to send in lisinopril/HCTZ 20-12.5 daily in 1 week. She will be having her blood drawn for her CMP and lipid panel today.

## 2022-10-18 LAB — COMPREHENSIVE METABOLIC PANEL
ALT: 20 IU/L (ref 0–32)
AST: 19 IU/L (ref 0–40)
Albumin/Globulin Ratio: 1.4 (ref 1.2–2.2)
Albumin: 4.3 g/dL (ref 3.8–4.8)
Alkaline Phosphatase: 80 IU/L (ref 44–121)
BUN/Creatinine Ratio: 16 (ref 12–28)
BUN: 12 mg/dL (ref 8–27)
Bilirubin Total: 0.3 mg/dL (ref 0.0–1.2)
CO2: 23 mmol/L (ref 20–29)
Calcium: 9.8 mg/dL (ref 8.7–10.3)
Chloride: 99 mmol/L (ref 96–106)
Creatinine, Ser: 0.73 mg/dL (ref 0.57–1.00)
Globulin, Total: 3 g/dL (ref 1.5–4.5)
Glucose: 238 mg/dL — ABNORMAL HIGH (ref 70–99)
Potassium: 4.2 mmol/L (ref 3.5–5.2)
Sodium: 137 mmol/L (ref 134–144)
Total Protein: 7.3 g/dL (ref 6.0–8.5)
eGFR: 84 mL/min/{1.73_m2} (ref >59)

## 2022-10-18 LAB — HEMOGLOBIN A1C
Est. average glucose Bld gHb Est-mCnc: 229 mg/dL
Hgb A1c MFr Bld: 9.6 % — ABNORMAL HIGH (ref 4.8–5.6)

## 2022-10-18 LAB — MICROALBUMIN / CREATININE URINE RATIO
Creatinine, Urine: 44 mg/dL
Microalb/Creat Ratio: 58 mg/g creat — ABNORMAL HIGH (ref 0–29)
Microalbumin, Urine: 25.4 ug/mL

## 2022-10-18 LAB — LIPID PANEL
Chol/HDL Ratio: 3.8 ratio (ref 0.0–4.4)
Cholesterol, Total: 281 mg/dL — ABNORMAL HIGH (ref 100–199)
HDL: 73 mg/dL (ref >39)
LDL Chol Calc (NIH): 196 mg/dL — ABNORMAL HIGH (ref 0–99)
Triglycerides: 75 mg/dL (ref 0–149)
VLDL Cholesterol Cal: 12 mg/dL (ref 5–40)

## 2022-10-18 LAB — VITAMIN D 25 HYDROXY (VIT D DEFICIENCY, FRACTURES): Vit D, 25-Hydroxy: 32.5 ng/mL (ref 30.0–100.0)

## 2022-10-18 LAB — HEPATITIS C ANTIBODY: Hep C Virus Ab: NONREACTIVE

## 2022-10-22 ENCOUNTER — Ambulatory Visit: Payer: 59 | Admitting: Physician Assistant

## 2022-10-22 ENCOUNTER — Other Ambulatory Visit: Payer: Self-pay | Admitting: Family Medicine

## 2022-10-22 DIAGNOSIS — E1169 Type 2 diabetes mellitus with other specified complication: Secondary | ICD-10-CM

## 2022-10-22 DIAGNOSIS — E1165 Type 2 diabetes mellitus with hyperglycemia: Secondary | ICD-10-CM

## 2022-10-22 MED ORDER — ROSUVASTATIN CALCIUM 10 MG PO TABS: 10.0000 mg | ORAL_TABLET | Freq: Every day | ORAL | 3 refills | Status: DC

## 2022-10-22 MED ORDER — SITAGLIPTIN PHOSPHATE 100 MG PO TABS
100.0000 mg | ORAL_TABLET | Freq: Every day | ORAL | 1 refills | Status: DC
Start: 2022-10-22 — End: 2022-12-18

## 2022-10-23 ENCOUNTER — Encounter: Payer: Self-pay | Admitting: Family Medicine

## 2022-10-23 ENCOUNTER — Other Ambulatory Visit: Payer: Self-pay | Admitting: Family Medicine

## 2022-10-23 DIAGNOSIS — I152 Hypertension secondary to endocrine disorders: Secondary | ICD-10-CM

## 2022-10-23 MED ORDER — LISINOPRIL-HYDROCHLOROTHIAZIDE 20-12.5 MG PO TABS
1.0000 | ORAL_TABLET | Freq: Every day | ORAL | 0 refills | Status: DC
Start: 2022-10-23 — End: 2022-11-14

## 2022-11-06 ENCOUNTER — Encounter: Payer: Self-pay | Admitting: Family Medicine

## 2022-11-13 ENCOUNTER — Other Ambulatory Visit: Payer: Self-pay | Admitting: Family Medicine

## 2022-11-13 DIAGNOSIS — I152 Hypertension secondary to endocrine disorders: Secondary | ICD-10-CM

## 2022-11-26 ENCOUNTER — Telehealth: Payer: Self-pay

## 2022-11-26 NOTE — Telephone Encounter (Signed)
Copied from CRM [id number]. Topic: General - Other >> Nov 26, 2022  9:43 AM Franchot Heidelberg wrote: Reason for CRM:  [facility] needs a Prior auth for the patient's echo tomorrow, please put a note in her appt.  Fax: [phone]

## 2022-11-27 ENCOUNTER — Ambulatory Visit: Payer: 59 | Attending: Family Medicine

## 2022-11-27 DIAGNOSIS — R011 Cardiac murmur, unspecified: Secondary | ICD-10-CM | POA: Diagnosis not present

## 2022-11-27 LAB — ECHOCARDIOGRAM COMPLETE
AR max vel: 1.9 cm2
AV Area VTI: 1.96 cm2
AV Area mean vel: 1.95 cm2
AV Mean grad: 5 mmHg
AV Peak grad: 9.1 mmHg
Ao pk vel: 1.51 m/s
Area-P 1/2: 4.63 cm2
Calc EF: 54.3 %
MV VTI: 2 cm2
S' Lateral: 2.1 cm
Single Plane A2C EF: 56 %
Single Plane A4C EF: 51.4 %

## 2022-11-27 LAB — HM DIABETES EYE EXAM

## 2022-12-13 ENCOUNTER — Other Ambulatory Visit: Payer: Self-pay | Admitting: Family Medicine

## 2022-12-13 DIAGNOSIS — I152 Hypertension secondary to endocrine disorders: Secondary | ICD-10-CM

## 2022-12-13 DIAGNOSIS — E1165 Type 2 diabetes mellitus with hyperglycemia: Secondary | ICD-10-CM

## 2022-12-13 NOTE — Telephone Encounter (Signed)
Spoke to patient and was able to get patient scheduled for HTN follow-up on Tuesday, January 21, 2023.

## 2022-12-13 NOTE — Telephone Encounter (Signed)
Requested medication (s) are due for refill today: Yes  Requested medication (s) are on the active medication list: Yes  Last refill:  11/20/22  Future visit scheduled: Yes  Notes to clinic:  Medication end date 12/21/22.     Requested Prescriptions  Pending Prescriptions Disp Refills   JANUVIA 100 MG tablet [Pharmacy Med Name: JANUVIA 100 MG TABLET] 30 tablet 1    Sig: TAKE 1 TABLET BY MOUTH EVERY DAY     Endocrinology:  Diabetes - DPP-4 Inhibitors Failed - 12/13/2022  2:51 PM      Failed - HBA1C is between 0 and 7.9 and within 180 days    Hgb A1c MFr Bld  Date Value Ref Range Status  10/17/2022 9.6 (H) 4.8 - 5.6 % Final    Comment:             Prediabetes: 5.7 - 6.4          Diabetes: >6.4          Glycemic control for adults with diabetes: <7.0          Passed - Cr in normal range and within 360 days    Creatinine, Ser  Date Value Ref Range Status  10/17/2022 0.73 0.57 - 1.00 mg/dL Final         Passed - Valid encounter within last 6 months    Recent Outpatient Visits           1 month ago Hypertension associated with type 2 diabetes mellitus (HCC)   [facility] Pardue, Monico Blitz, DO   2 months ago Hypertension associated with type 2 diabetes mellitus (HCC)   [facility] Pardue, Monico Blitz, DO       Future Appointments             In 1 month Pardue, Monico Blitz, DO [facility]             lisinopril-hydrochlorothiazide (ZESTORETIC) 20-12.5 MG tablet [Pharmacy Med Name: LISINOPRIL-HCTZ 20-12.5 MG TAB] 30 tablet 0    Sig: TAKE 1 TABLET BY MOUTH EVERY DAY     Cardiovascular:  ACEI + Diuretic Combos Failed - 12/13/2022  2:51 PM      Failed - Last BP in normal range    BP Readings from Last 1 Encounters:  10/17/22 (!) 159/82         Passed - Na in normal range and within 180 days    Sodium  Date Value Ref Range Status  10/17/2022 137 134 - 144 mmol/L Final         Passed - K  in normal range and within 180 days    Potassium  Date Value Ref Range Status  10/17/2022 4.2 3.5 - 5.2 mmol/L Final         Passed - Cr in normal range and within 180 days    Creatinine, Ser  Date Value Ref Range Status  10/17/2022 0.73 0.57 - 1.00 mg/dL Final         Passed - eGFR is 30 or above and within 180 days    GFR calc Af Amer  Date Value Ref Range Status  03/04/2017 >60 >60 mL/min Final    Comment:    (NOTE) The eGFR has been calculated using the CKD EPI equation. This calculation has not been validated in all clinical situations. eGFR's persistently <60 mL/min signify possible Chronic Kidney Disease.    GFR, Estimated  Date Value  Ref Range Status  12/24/2021 >60 >60 mL/min Final    Comment:    (NOTE) Calculated using the CKD-EPI Creatinine Equation (2021)    eGFR  Date Value Ref Range Status  10/17/2022 84 >59 mL/min/1.73 Final         Passed - Patient is not pregnant      Passed - Valid encounter within last 6 months    Recent Outpatient Visits           1 month ago Hypertension associated with type 2 diabetes mellitus [facility])   [facility] Pardue, Monico Blitz, DO   2 months ago Hypertension associated with type 2 diabetes mellitus [facility])   [facility] Pardue, Monico Blitz, DO       Future Appointments             In 1 month Pardue, Monico Blitz, DO [facility]

## 2022-12-16 ENCOUNTER — Encounter: Payer: Self-pay | Admitting: Family Medicine

## 2022-12-17 ENCOUNTER — Other Ambulatory Visit: Payer: Self-pay

## 2023-01-05 ENCOUNTER — Other Ambulatory Visit: Payer: Self-pay | Admitting: Family Medicine

## 2023-01-05 DIAGNOSIS — I152 Hypertension secondary to endocrine disorders: Secondary | ICD-10-CM

## 2023-01-05 DIAGNOSIS — E1165 Type 2 diabetes mellitus with hyperglycemia: Secondary | ICD-10-CM

## 2023-01-21 ENCOUNTER — Ambulatory Visit (INDEPENDENT_AMBULATORY_CARE_PROVIDER_SITE_OTHER): Payer: 59 | Admitting: Family Medicine

## 2023-01-21 ENCOUNTER — Encounter: Payer: Self-pay | Admitting: Family Medicine

## 2023-01-21 VITALS — BP 134/71 | HR 85 | Temp 97.6°F | Resp 12 | Ht 63.0 in | Wt 158.0 lb

## 2023-01-21 DIAGNOSIS — J9809 Other diseases of bronchus, not elsewhere classified: Secondary | ICD-10-CM | POA: Diagnosis not present

## 2023-01-21 DIAGNOSIS — R062 Wheezing: Secondary | ICD-10-CM | POA: Insufficient documentation

## 2023-01-21 DIAGNOSIS — E782 Mixed hyperlipidemia: Secondary | ICD-10-CM

## 2023-01-21 DIAGNOSIS — E1159 Type 2 diabetes mellitus with other circulatory complications: Secondary | ICD-10-CM

## 2023-01-21 DIAGNOSIS — I152 Hypertension secondary to endocrine disorders: Secondary | ICD-10-CM

## 2023-01-21 DIAGNOSIS — R053 Chronic cough: Secondary | ICD-10-CM | POA: Diagnosis not present

## 2023-01-21 DIAGNOSIS — E1165 Type 2 diabetes mellitus with hyperglycemia: Secondary | ICD-10-CM | POA: Diagnosis not present

## 2023-01-21 DIAGNOSIS — Z0279 Encounter for issue of other medical certificate: Secondary | ICD-10-CM

## 2023-01-21 DIAGNOSIS — C50912 Malignant neoplasm of unspecified site of left female breast: Secondary | ICD-10-CM

## 2023-01-21 DIAGNOSIS — E1169 Type 2 diabetes mellitus with other specified complication: Secondary | ICD-10-CM

## 2023-01-21 HISTORY — DX: Wheezing: R06.2

## 2023-01-21 LAB — POCT GLYCOSYLATED HEMOGLOBIN (HGB A1C): Hemoglobin A1C: 8.9 % — AB (ref 4.0–5.6)

## 2023-01-21 MED ORDER — LISINOPRIL-HYDROCHLOROTHIAZIDE 20-12.5 MG PO TABS
1.0000 | ORAL_TABLET | Freq: Every day | ORAL | 3 refills | Status: DC
Start: 2023-01-21 — End: 2023-02-23

## 2023-01-21 MED ORDER — EMPAGLIFLOZIN 10 MG PO TABS
10.0000 mg | ORAL_TABLET | Freq: Every day | ORAL | 1 refills | Status: DC
Start: 2023-01-21 — End: 2023-03-03

## 2023-01-21 MED ORDER — FULL KIT NEBULIZER SET MISC: 1.0000 | Freq: Once | 0 refills | Status: DC

## 2023-01-21 MED ORDER — SITAGLIPTIN PHOSPHATE 100 MG PO TABS
100.0000 mg | ORAL_TABLET | Freq: Every day | ORAL | 3 refills | Status: DC
Start: 2023-01-21 — End: 2023-01-21

## 2023-01-21 MED ORDER — OZEMPIC (0.25 OR 0.5 MG/DOSE) 2 MG/3ML ~~LOC~~ SOPN
0.2500 mg | PEN_INJECTOR | SUBCUTANEOUS | 1 refills | Status: DC
Start: 2023-01-21 — End: 2023-03-03

## 2023-01-21 MED ORDER — IPRATROPIUM-ALBUTEROL 0.5-2.5 (3) MG/3ML IN SOLN
3.0000 mL | Freq: Four times a day (QID) | RESPIRATORY_TRACT | 1 refills | Status: DC | PRN
Start: 2023-01-21 — End: 2023-07-09

## 2023-01-21 NOTE — Assessment & Plan Note (Signed)
Being managed regularly by patient's oncologist. Goes back again later this month. Most recent mammogram negative.

## 2023-01-21 NOTE — Patient Instructions (Addendum)
Stop Glipizide May take Januvia with Jardiance until you receive and start the Ozempic. Then stop the Januvia. Take only Jardiance and Ozempic for diabetes.

## 2023-01-21 NOTE — Progress Notes (Unsigned)
Annual Wellness Visit     Patient: Vicki West, Female    DOB: [birth year], 77 y.o.   MRN: [id number] Visit Date: 01/21/2023  Today's Provider: Sherlyn Hay, DO   Chief Complaint  Patient presents with   Medical Management of Chronic Issues   Subjective    Vicki West is a 77 y.o. female who presents today for her Annual Wellness Visit. She reports consuming a diabetic, diet. The patient does not participate in regular exercise at present. She generally feels fairly well. She reports sleeping fairly well. She does have additional problems to discuss today.   HPI Patient presents for routine follow-up and her annual wellness exam.  Her daughter-in-law is with her today.  They has been unable to check the patient's BGs in the morning before she eats because her daughter-in-law is the one who has to check it, as the patient is unable to, and she lives across town.  - She did check it randomly in the middle of the day yesterday; the patient hadn't eaten anything since the morning and it was 117.  - The patient has been reducing sugar in her diet (eating fewer [employer] snacks; does not drink soda).  They have been checking her blood pressures intermittently at home and her blood pressures have been consistent with that which was obtained in the office.   Medications: Outpatient Medications Prior to Visit  Medication Sig   albuterol (VENTOLIN HFA) 108 (90 Base) MCG/ACT inhaler Inhale 1-2 puffs into the lungs every 6 (six) hours as needed for shortness of breath.   anastrozole (ARIMIDEX) 1 MG tablet Take 1 mg by mouth daily.   Blood Glucose Monitoring Suppl DEVI 1 each by Does not apply route in the morning, at noon, and at bedtime. May substitute to any manufacturer covered by patient's insurance.   calcium carbonate (TUMS - DOSED IN MG ELEMENTAL CALCIUM) 500 MG chewable tablet Chew 1 tablet by mouth daily.   Glucose Blood (BLOOD GLUCOSE TEST STRIPS) STRP 1 each by In  Vitro route daily before breakfast. May substitute to any manufacturer covered by patient's insurance.   Lancet Device MISC 1 each by Does not apply route daily before breakfast. May substitute to any manufacturer covered by patient's insurance.   rosuvastatin (CRESTOR) 10 MG tablet Take 1 tablet (10 mg total) by mouth daily.   Spacer/Aero-Holding Chambers DEVI Use in combination with albuterol inhaler, following the instructions for the albuterol   VITAMIN D, CHOLECALCIFEROL, PO Take by mouth.   [DISCONTINUED] glipiZIDE (GLUCOTROL XL) 5 MG 24 hr tablet Take 5 mg by mouth 2 (two) times daily.   [DISCONTINUED] JANUVIA 100 MG tablet TAKE 1 TABLET BY MOUTH EVERY DAY   [DISCONTINUED] lisinopril-hydrochlorothiazide (ZESTORETIC) 20-12.5 MG tablet TAKE 1 TABLET BY MOUTH EVERY DAY   No facility-administered medications prior to visit.    No Known Allergies  Patient Care Team: , Monico Blitz, DO as PCP - General (Family Medicine) Hulen Luster, RN as Oncology Nurse Navigator  Review of Systems  Constitutional:  Negative for appetite change, chills, fatigue and fever.  Respiratory:  Positive for cough and shortness of breath (when coughing). Negative for chest tightness.   Cardiovascular:  Negative for chest pain and palpitations.  Gastrointestinal:  Negative for abdominal pain, nausea and vomiting.  Neurological:  Negative for dizziness and weakness.         Objective    Vitals: BP 134/71 (BP Location: Left Arm, Patient Position: Sitting, Cuff Size: Normal)  Pulse 85   Temp 97.6 F (36.4 C) (Temporal)   Resp 12   Ht 5\' 3"  (1.6 m)   Wt 158 lb (71.7 kg)   SpO2 99%   BMI 27.99 kg/m      Physical Exam Vitals and nursing note reviewed.  Constitutional:      General: She is not in acute distress.    Appearance: Normal appearance.  HENT:     Head: Normocephalic and atraumatic.  Eyes:     General: No scleral icterus.    Conjunctiva/sclera: Conjunctivae normal.  Cardiovascular:      Rate and Rhythm: Normal rate.  Pulmonary:     Effort: Pulmonary effort is normal. Prolonged expiration (intermittent) present. No tachypnea, accessory muscle usage or respiratory distress.     Breath sounds: Decreased air movement present. No stridor. Examination of the right-upper field reveals wheezing. Decreased breath sounds and wheezing (very quiet) present. No rhonchi or rales.  Neurological:     Mental Status: She is alert and oriented to person, place, and time. Mental status is at baseline.  Psychiatric:        Mood and Affect: Mood normal.        Behavior: Behavior normal.     Most recent functional status assessment:    10/10/2022   10:49 AM  In your present state of health, do you have any difficulty performing the following activities:  Hearing? 0  Vision? 1  Difficulty concentrating or making decisions? 1  Walking or climbing stairs? 0  Dressing or bathing? 0  Doing errands, shopping? 0   Most recent fall risk assessment:    01/21/2023    1:16 PM  Fall Risk   Falls in the past year? 0  Number falls in past yr: 0  Injury with Fall? 0  Risk for fall due to : No Fall Risks  Follow up Falls evaluation completed    Most recent depression screenings:    01/21/2023    1:16 PM 10/10/2022   10:49 AM  PHQ 2/9 Scores  PHQ - 2 Score 0 0  PHQ- 9 Score  0   Most recent cognitive screening:    01/21/2023    1:33 PM  6CIT Screen  What Year? 0 points  What month? 0 points  What time? 0 points  Count back from 20 0 points  Months in reverse 4 points  Repeat phrase 10 points  Total Score 14 points   Most recent Audit-C alcohol use screening    10/10/2022   10:49 AM  Alcohol Use Disorder Test (AUDIT)  1. How often do you have a drink containing alcohol? 2  2. How many drinks containing alcohol do you have on a typical day when you are drinking? 0  3. How often do you have six or more drinks on one occasion? 1  AUDIT-C Score 3   A score of 3 or more in women,  and 4 or more in men indicates increased risk for alcohol abuse, EXCEPT if all of the points are from question 1   Results for orders placed or performed in visit on 01/21/23  POCT glycosylated hemoglobin (Hb A1C)  Result Value Ref Range   Hemoglobin A1C 8.9 (A) 4.0 - 5.6 %    Assessment & Plan     Annual wellness visit done today including the all of the following: Reviewed patient's Family Medical History Reviewed and updated list of patient's medical providers Assessment of cognitive impairment was done Assessed patient's functional  ability Established a written schedule for health screening services Health Risk Assessent Completed and Reviewed  Exercise Activities and Dietary recommendations  Goals   None     Immunization History  Administered Date(s) Administered   PNEUMOCOCCAL CONJUGATE-20 01/15/2022    Health Maintenance  Topic Date Due   COVID-19 Vaccine (1) Never done   Zoster Vaccines- Shingrix (1 of 2) Never done   Medicare Annual Wellness (AWV)  01/16/2023   INFLUENZA VACCINE  09/08/2023 (Originally 01/09/2023)   HEMOGLOBIN A1C  07/24/2023   FOOT EXAM  10/10/2023   Diabetic kidney evaluation - eGFR measurement  10/17/2023   Diabetic kidney evaluation - Urine ACR  10/17/2023   OPHTHALMOLOGY EXAM  11/27/2023   Pneumonia Vaccine 32+ Years old  Completed   DEXA SCAN  Completed   Hepatitis C Screening  Completed   HPV VACCINES  Aged Out   DTaP/Tdap/Td  Discontinued     Discussed health benefits of physical activity, and encouraged her to engage in regular exercise appropriate for her age and condition.  She is not presently on any controlled substances.   Type 2 diabetes mellitus with hyperglycemia, without long-term current use of insulin (HCC) Assessment & Plan: Patient's A1c is somewhat improved today at 8.9.  Discussed that glipizide has the potential to be dangerous in older age and recommended we go ahead and switch to an alternative medication.   Patient would likely benefit from a combination of an SGLT2 and GLP-1, so we will also stop patient's Januvia and start her on both Jardiance and Ozempic.  Orders: -     POCT glycosylated hemoglobin (Hb A1C) -     Ozempic (0.25 or 0.5 MG/DOSE); Inject 0.25 mg into the skin once a week.  Dispense: 3 mL; Refill: 1 -     Empagliflozin; Take 1 tablet (10 mg total) by mouth daily. Take one tablet by mouth daily  Dispense: 90 tablet; Refill: 1  Recurrent bronchospasm Assessment & Plan: Given that the patient's symptoms have not been helped by the albuterol, even when using the spacer, we will go ahead and refer patient to pulmonology for further evaluation and probable pulmonary function test. Printed prescription for nebulizer kit and gave it to patient's daughter-in-law.  Also sent prescription for DuoNeb as noted below.  Discussed that these appear to require a prior authorization and may not be covered by the patient's insurance.  Orders: -     Ambulatory referral to Pulmonology -     Full Kit Nebulizer Set; 1 each by Does not apply route once for 1 dose.  Dispense: 1 each; Refill: 0 -     Ipratropium-Albuterol; Take 3 mLs by nebulization every 6 (six) hours as needed.  Dispense: 360 mL; Refill: 1  Chronic cough -     Ambulatory referral to Pulmonology -     Full Kit Nebulizer Set; 1 each by Does not apply route once for 1 dose.  Dispense: 1 each; Refill: 0 -     Ipratropium-Albuterol; Take 3 mLs by nebulization every 6 (six) hours as needed.  Dispense: 360 mL; Refill: 1  Hypertension associated with type 2 diabetes mellitus (HCC) Assessment & Plan: Patient's blood pressure is fairly well-controlled today.  Will continue her on lisinopril-hydrochlorothiazide 20-12.5 mg daily.  Orders: -     Lisinopril-hydroCHLOROthiazide; Take 1 tablet by mouth daily.  Dispense: 90 tablet; Refill: 3  Mixed hyperlipidemia due to type 2 diabetes mellitus [facility]) Assessment & Plan: Patient doing well on  current dose of  rosuvastatin.  Will continue rosuvastatin 10 mg daily and recheck patient's lipid panel at her next visit in 3 months.   Invasive ductal carcinoma of left breast [facility]) Assessment & Plan: Being managed regularly by patient's oncologist, Dr. Nilda Riggs. Goes back again later this month. Most recent mammogram negative.   Wheezing Assessment & Plan: Addressed as noted above.  Orders: -     Full Kit Nebulizer Set; 1 each by Does not apply route once for 1 dose.  Dispense: 1 each; Refill: 0 -     Ipratropium-Albuterol; Take 3 mLs by nebulization every 6 (six) hours as needed.  Dispense: 360 mL; Refill: 1    Return in about 3 months (around 04/23/2023).     I discussed the assessment and treatment plan with the patient  The patient was provided an opportunity to ask questions and all were answered. The patient agreed with the plan and demonstrated an understanding of the instructions.   The patient was advised to call back or seek an in-person evaluation if the symptoms worsen or if the condition fails to improve as anticipated.    Sherlyn Hay, DO  [facility] [phone] (phone) [phone] (fax)  [facility]

## 2023-01-22 NOTE — Assessment & Plan Note (Signed)
Patient's blood pressure is fairly well-controlled today.  Will continue her on lisinopril-hydrochlorothiazide 20-12.5 mg daily.

## 2023-01-22 NOTE — Assessment & Plan Note (Signed)
Patient's A1c is somewhat improved today at 8.9.  Discussed that glipizide has the potential to be dangerous in older age and recommended we go ahead and switch to an alternative medication.  Patient would likely benefit from a combination of an SGLT2 and GLP-1, so we will also stop patient's Januvia and start her on both Jardiance and Ozempic.

## 2023-01-22 NOTE — Assessment & Plan Note (Signed)
Patient doing well on current dose of rosuvastatin.  Will continue rosuvastatin 10 mg daily and recheck patient's lipid panel at her next visit in 3 months.

## 2023-01-22 NOTE — Assessment & Plan Note (Addendum)
Given that the patient's symptoms have not been helped by the albuterol, even when using the spacer, we will go ahead and refer patient to pulmonology for further evaluation and probable pulmonary function test. Printed prescription for nebulizer kit and gave it to patient's daughter-in-law.  Also sent prescription for DuoNeb as noted below.  Discussed that these appear to require a prior authorization and may not be covered by the patient's insurance.

## 2023-01-22 NOTE — Assessment & Plan Note (Signed)
Addressed as noted above.

## 2023-01-25 ENCOUNTER — Encounter: Payer: Self-pay | Admitting: Family Medicine

## 2023-01-30 ENCOUNTER — Other Ambulatory Visit
Admission: RE | Admit: 2023-01-30 | Discharge: 2023-01-30 | Disposition: A | Discharge status: Home / Self Care | Payer: 59 | Source: Ambulatory Visit

## 2023-01-30 DIAGNOSIS — H532 Diplopia: Secondary | ICD-10-CM | POA: Insufficient documentation

## 2023-01-30 LAB — CBC
HCT: 35.8 % — ABNORMAL LOW (ref 36.0–46.0)
Hemoglobin: 11.7 g/dL — ABNORMAL LOW (ref 12.0–15.0)
MCH: 30.3 pg (ref 26.0–34.0)
MCHC: 32.7 g/dL (ref 30.0–36.0)
MCV: 92.7 fL (ref 80.0–100.0)
Platelets: 226 10*3/uL (ref 150–400)
RBC: 3.86 MIL/uL — ABNORMAL LOW (ref 3.87–5.11)
RDW: 14.6 % (ref 11.5–15.5)
WBC: 7 10*3/uL (ref 4.0–10.5)
nRBC: 0 % (ref 0.0–0.2)

## 2023-01-30 LAB — C-REACTIVE PROTEIN: CRP: 0.7 mg/dL (ref <1.0)

## 2023-01-30 LAB — SEDIMENTATION RATE: Sed Rate: 49 mm/hr — ABNORMAL HIGH (ref 0–30)

## 2023-02-03 LAB — ACETYLCHOLINE RECEPTOR, BINDING: Acety choline binding ab: 0.09 nmol/L (ref 0.00–0.24)

## 2023-02-19 ENCOUNTER — Encounter: Payer: Self-pay | Admitting: Family Medicine

## 2023-02-19 ENCOUNTER — Ambulatory Visit
Admission: RE | Admit: 2023-02-19 | Discharge: 2023-02-19 | Disposition: A | Discharge status: Home / Self Care | Payer: 59 | Source: Ambulatory Visit | Attending: Student in an Organized Health Care Education/Training Program | Admitting: Student in an Organized Health Care Education/Training Program

## 2023-02-19 ENCOUNTER — Ambulatory Visit (INDEPENDENT_AMBULATORY_CARE_PROVIDER_SITE_OTHER): Payer: 59 | Admitting: Student in an Organized Health Care Education/Training Program

## 2023-02-19 ENCOUNTER — Other Ambulatory Visit: Payer: Self-pay | Admitting: Family Medicine

## 2023-02-19 ENCOUNTER — Encounter: Payer: Self-pay | Admitting: Student in an Organized Health Care Education/Training Program

## 2023-02-19 VITALS — BP 92/58 | HR 92 | Temp 97.6°F | Ht 63.0 in | Wt 152.2 lb

## 2023-02-19 DIAGNOSIS — R053 Chronic cough: Secondary | ICD-10-CM

## 2023-02-19 DIAGNOSIS — Z23 Encounter for immunization: Secondary | ICD-10-CM | POA: Diagnosis not present

## 2023-02-19 DIAGNOSIS — E1159 Type 2 diabetes mellitus with other circulatory complications: Secondary | ICD-10-CM

## 2023-02-19 NOTE — Progress Notes (Signed)
Synopsis: Referred in for chronic by Sherlyn Hay, DO  Assessment & Plan:   1. Chronic cough  History difficult to elucidate today but exam findings are overall benign with no wheeze or adventitious breath sounds.  For workup, I will obtain a pulmonary function test to assess spirometry for obstruction as well as lung volumes and DLCO for restriction.  We will obtain a chest x-ray today and consider further advanced imaging (such as HRCT) depending on findings from the pulmonary function test.  Given that her symptoms improve at night with the use of cetirizine, I have asked them to try using the cetirizine twice daily and see if symptoms improved. I will hold off on initiating any new inhalers or nebulizers until after pulmonary function testing is complete. I will also reach out to her primary care physician to transitioning her off ACE inhibitor and to ARB.  - Pulmonary Function Test [facility] Only; Future - DG Chest 2 View; Future - Switch ACE to ARB - Use Cetirizine twice daily   Return in about 5 weeks (around 03/26/2023).  I spent 60 minutes caring for this patient today, including preparing to see the patient, obtaining a medical history , reviewing a separately obtained history, performing a medically appropriate examination and/or evaluation, counseling and educating the patient/family/caregiver, ordering medications, tests, or procedures, referring and communicating with other health care professionals (not separately reported), and documenting clinical information in the electronic health record  Raechel Chute, MD [facility] 02/19/2023 9:44 AM    End of visit medications:  No orders of the defined types were placed in this encounter.    Current Outpatient Medications:    anastrozole (ARIMIDEX) 1 MG tablet, Take 1 mg by mouth daily., Disp: , Rfl:    Blood Glucose Monitoring Suppl DEVI, 1 each by Does not apply route in the morning, at noon, and at  bedtime. May substitute to any manufacturer covered by patient's insurance., Disp: 1 each, Rfl: 0   calcium carbonate (TUMS - DOSED IN MG ELEMENTAL CALCIUM) 500 MG chewable tablet, Chew 1 tablet by mouth daily., Disp: , Rfl:    empagliflozin (JARDIANCE) 10 MG TABS tablet, Take 1 tablet (10 mg total) by mouth daily. Take one tablet by mouth daily, Disp: 90 tablet, Rfl: 1   Glucose Blood (BLOOD GLUCOSE TEST STRIPS) STRP, 1 each by In Vitro route daily before breakfast. May substitute to any manufacturer covered by patient's insurance., Disp: 30 each, Rfl: 11   ipratropium-albuterol (DUONEB) 0.5-2.5 (3) MG/3ML SOLN, Take 3 mLs by nebulization every 6 (six) hours as needed., Disp: 360 mL, Rfl: 1   Lancet Device MISC, 1 each by Does not apply route daily before breakfast. May substitute to any manufacturer covered by patient's insurance., Disp: 1 each, Rfl: 11   lisinopril-hydrochlorothiazide (ZESTORETIC) 20-12.5 MG tablet, Take 1 tablet by mouth daily., Disp: 90 tablet, Rfl: 3   rosuvastatin (CRESTOR) 10 MG tablet, Take 1 tablet (10 mg total) by mouth daily., Disp: 90 tablet, Rfl: 3   Semaglutide,0.25 or 0.5MG /DOS, (OZEMPIC, 0.25 OR 0.5 MG/DOSE,) 2 MG/3ML SOPN, Inject 0.25 mg into the skin once a week., Disp: 3 mL, Rfl: 1   VITAMIN D, CHOLECALCIFEROL, PO, Take by mouth., Disp: , Rfl:    albuterol (VENTOLIN HFA) 108 (90 Base) MCG/ACT inhaler, Inhale 1-2 puffs into the lungs every 6 (six) hours as needed for shortness of breath. (Patient not taking: Reported on 02/19/2023), Disp: 1 each, Rfl: 1   Respiratory Therapy Supplies (FULL  KIT NEBULIZER SET) MISC, 1 each by Does not apply route once for 1 dose., Disp: 1 each, Rfl: 0   Spacer/Aero-Holding Chambers DEVI, Use in combination with albuterol inhaler, following the instructions for the albuterol (Patient not taking: Reported on 02/19/2023), Disp: 1 each, Rfl: 0   Subjective:   PATIENT ID: Vicki West GENDER: female DOB: [birth year], MRN:  [id number]  Chief Complaint  Patient presents with   pulmonary consult    Patient reports chronic cough x years. Patient reports she is not using ventolin or duoneb.     HPI  Patient is a pleasant 77 year old female presenting to clinic for evaluation of chronic cough.  She reports a cough that is ongoing for over a year happening sporadically throughout the day without any exacerbating or alleviating symptoms.  On further questioning, patient does report that the cough is better at night.  She also reports that it is worse when she lays flat though further history is very difficult to elucidate.  She does report scant sputum production that is very light/clear in color and denies any hemoptysis associated with it.  She denies any wheezing, chest pain, chest tightness, fevers, chills, night sweats, or weight changes.  She reports some nasal drainage that has improved with the use of cetirizine.  She takes cetirizine at night.  She was not able to use her albuterol inhaler given difficulty coordinating and with technique.  Patient does not have any personal history of lung disease nor does she have any history of asthma herself.  She does not have any symptoms similar to this in the past growing up.  She has family history of asthma and her son.  Patient does not have any pets and denies any current occupational exposures.  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  [place]  She does not have any birds.  She smoked for around 10 years but quit in the late 70s/early 80s and has around 10 pack years of smoking history.  Ancillary information including prior medications, full medical/surgical/family/social histories, and PFTs (when available) are listed below and have been reviewed.   Review of Systems  Constitutional:  Negative for chills, fever and weight loss.  Respiratory:  Positive for cough. Negative for hemoptysis, sputum production,  shortness of breath and wheezing.   Cardiovascular:  Negative for chest pain, palpitations, orthopnea, leg swelling and PND.     Objective:   Vitals:   02/19/23 0845  BP: (!) 92/58  Pulse: 92  Temp: 97.6 F (36.4 C)  TempSrc: Temporal  SpO2: 95%  Weight: 152 lb 3.2 oz (69 kg)  Height: 5\' 3"  (1.6 m)   95% on RA  BMI Readings from Last 3 Encounters:  02/19/23 26.96 kg/m  01/21/23 27.99 kg/m  10/17/22 28.87 kg/m   Wt Readings from Last 3 Encounters:  02/19/23 152 lb 3.2 oz (69 kg)  01/21/23 158 lb (71.7 kg)  10/17/22 163 lb (73.9 kg)    Physical Exam Constitutional:      Appearance: Normal appearance.  Cardiovascular:     Rate and Rhythm: Normal rate and regular rhythm.     Pulses: Normal pulses.     Heart sounds: Normal heart sounds.  Pulmonary:     Effort: Pulmonary effort is normal.     Breath sounds: Normal breath sounds.  Musculoskeletal:     Right lower leg: No edema.     Left lower leg: No edema.  Neurological:     General: No focal deficit present.     Mental Status: She is alert and oriented to person, place, and time. Mental status is at baseline.       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)    Diabetes mellitus without complication (HCC)    Patient denies medical problems      Family History  Problem Relation Age of Onset   Hyperlipidemia Sister    Hypertension Son    Hyperlipidemia Son    Diabetes Son      Past Surgical History:  Procedure Laterality Date   ABDOMINAL HYSTERECTOMY     BREAST SURGERY      Social History   Socioeconomic History   Marital status: Divorced    Spouse name: Not on file   Number of children: 4   Years of education: Not on file   Highest education level: 8th grade  Occupational History   Not on file  Tobacco Use   Smoking status: Former    Current packs/day: 0.00    Average packs/day: 1.5 packs/day  for 6.0 years (9.0 ttl pk-yrs)    Types: Cigarettes    Start date: 06/10/1972    Quit date: 06/10/1978    Years since quitting: 44.7   Smokeless tobacco: Never  Vaping Use   Vaping status: Never Used  Substance and Sexual Activity   Alcohol use: Yes    Alcohol/week: 1.0 standard drink of alcohol    Types: 1 Cans of beer per week   Drug use: No   Sexual activity: Not Currently  Other Topics Concern   Not on file  Social History Narrative   Not on file   Social Determinants of Health   Financial Resource Strain: Not on file  Food Insecurity: No Food Insecurity (10/10/2022)   Hunger Vital Sign    Worried About Running Out of Food in the Last Year: Never true    Ran Out of Food in the Last Year: Never true  Transportation Needs: No Transportation Needs (10/10/2022)   PRAPARE - Administrator, Civil Service (Medical): No    Lack of Transportation (Non-Medical): No  Physical Activity: Not on file  Stress: Not on file  Social Connections: Not on file  Intimate Partner Violence: Not At Risk (10/10/2022)   Humiliation, Afraid, Rape, and Kick questionnaire    Fear of Current or Ex-Partner: No    Emotionally Abused: No    Physically Abused: No    Sexually Abused: No     No Known Allergies   CBC    Component Value Date/Time   WBC 7.0 01/30/2023 1707   RBC 3.86 (L) 01/30/2023 1707   HGB 11.7 (L) 01/30/2023 1707   HCT 35.8 (L) 01/30/2023 1707   PLT 226 01/30/2023 1707   MCV 92.7 01/30/2023 1707   MCH 30.3 01/30/2023 1707   MCHC 32.7 01/30/2023 1707   RDW 14.6 01/30/2023 1707    Pulmonary Functions Testing Results:     No data to display          Outpatient Medications Prior to Visit  Medication Sig Dispense Refill   anastrozole (ARIMIDEX) 1 MG tablet Take 1 mg by mouth daily.     Blood Glucose Monitoring Suppl DEVI 1 each by Does not apply route in the morning, at noon, and at bedtime. May substitute to any manufacturer  covered by patient's insurance. 1 each 0    calcium carbonate (TUMS - DOSED IN MG ELEMENTAL CALCIUM) 500 MG chewable tablet Chew 1 tablet by mouth daily.     empagliflozin (JARDIANCE) 10 MG TABS tablet Take 1 tablet (10 mg total) by mouth daily. Take one tablet by mouth daily 90 tablet 1   Glucose Blood (BLOOD GLUCOSE TEST STRIPS) STRP 1 each by In Vitro route daily before breakfast. May substitute to any manufacturer covered by patient's insurance. 30 each 11   ipratropium-albuterol (DUONEB) 0.5-2.5 (3) MG/3ML SOLN Take 3 mLs by nebulization every 6 (six) hours as needed. 360 mL 1   Lancet Device MISC 1 each by Does not apply route daily before breakfast. May substitute to any manufacturer covered by patient's insurance. 1 each 11   lisinopril-hydrochlorothiazide (ZESTORETIC) 20-12.5 MG tablet Take 1 tablet by mouth daily. 90 tablet 3   rosuvastatin (CRESTOR) 10 MG tablet Take 1 tablet (10 mg total) by mouth daily. 90 tablet 3   Semaglutide,0.25 or 0.5MG /DOS, (OZEMPIC, 0.25 OR 0.5 MG/DOSE,) 2 MG/3ML SOPN Inject 0.25 mg into the skin once a week. 3 mL 1   VITAMIN D, CHOLECALCIFEROL, PO Take by mouth.     albuterol (VENTOLIN HFA) 108 (90 Base) MCG/ACT inhaler Inhale 1-2 puffs into the lungs every 6 (six) hours as needed for shortness of breath. (Patient not taking: Reported on 02/19/2023) 1 each 1   Respiratory Therapy Supplies (FULL KIT NEBULIZER SET) MISC 1 each by Does not apply route once for 1 dose. 1 each 0   Spacer/Aero-Holding Rudean Curt Use in combination with albuterol inhaler, following the instructions for the albuterol (Patient not taking: Reported on 02/19/2023) 1 each 0   No facility-administered medications prior to visit.

## 2023-02-23 ENCOUNTER — Other Ambulatory Visit: Payer: Self-pay | Admitting: Family Medicine

## 2023-02-23 DIAGNOSIS — I152 Hypertension secondary to endocrine disorders: Secondary | ICD-10-CM

## 2023-02-23 MED ORDER — HYDROCHLOROTHIAZIDE 12.5 MG PO TABS: 12.5000 mg | ORAL_TABLET | Freq: Every day | ORAL | 3 refills | Status: DC

## 2023-02-23 MED ORDER — VALSARTAN 40 MG PO TABS
40.0000 mg | ORAL_TABLET | Freq: Every day | ORAL | 3 refills | Status: DC
Start: 2023-02-23 — End: 2023-04-23

## 2023-02-24 ENCOUNTER — Ambulatory Visit (INDEPENDENT_AMBULATORY_CARE_PROVIDER_SITE_OTHER): Payer: 59

## 2023-02-24 VITALS — Ht 63.0 in | Wt 152.0 lb

## 2023-02-24 DIAGNOSIS — Z0000 Encounter for general adult medical examination without abnormal findings: Secondary | ICD-10-CM

## 2023-02-24 NOTE — Progress Notes (Signed)
Subjective:   Vicki West is a 77 y.o. female who presents for an Initial Medicare Annual Wellness Visit.  Visit Complete: Virtual  I connected with  Vicki West on 02/24/23 by a audio enabled telemedicine application and verified that I am speaking with the correct person using two identifiers.  Patient Location: Home  Provider Location: Office/Clinic  I discussed the limitations of evaluation and management by telemedicine. The patient expressed understanding and agreed to proceed.  Vital Signs: Unable to obtain new vitals due to this being a telehealth visit.  Patient Medicare AWV questionnaire was completed by the patient on 02/23/23; I have confirmed that all information answered by patient is correct and no changes since this date.  Cardiac Risk Factors include: advanced age (>75men, >29 women);diabetes mellitus;dyslipidemia;sedentary lifestyle;hypertension     Objective:    Today's Vitals   02/24/23 1141  Weight: 152 lb (68.9 kg)  Height: 5\' 3"  (1.6 m)   Body mass index is 26.93 kg/m.     12/24/2021    6:47 PM 11/06/2016    4:46 PM  Advanced Directives  Does Patient Have a Medical Advance Directive? No No    Current Medications (verified) Outpatient Encounter Medications as of 02/24/2023  Medication Sig   albuterol (VENTOLIN HFA) 108 (90 Base) MCG/ACT inhaler Inhale 1-2 puffs into the lungs every 6 (six) hours as needed for shortness of breath.   anastrozole (ARIMIDEX) 1 MG tablet Take 1 mg by mouth daily.   Blood Glucose Monitoring Suppl DEVI 1 each by Does not apply route in the morning, at noon, and at bedtime. May substitute to any manufacturer covered by patient's insurance.   calcium carbonate (TUMS - DOSED IN MG ELEMENTAL CALCIUM) 500 MG chewable tablet Chew 1 tablet by mouth daily.   empagliflozin (JARDIANCE) 10 MG TABS tablet Take 1 tablet (10 mg total) by mouth daily. Take one tablet by mouth daily   Glucose Blood (BLOOD GLUCOSE TEST STRIPS) STRP  1 each by In Vitro route daily before breakfast. May substitute to any manufacturer covered by patient's insurance.   hydrochlorothiazide (HYDRODIURIL) 12.5 MG tablet Take 1 tablet (12.5 mg total) by mouth daily.   ipratropium-albuterol (DUONEB) 0.5-2.5 (3) MG/3ML SOLN Take 3 mLs by nebulization every 6 (six) hours as needed.   Lancet Device MISC 1 each by Does not apply route daily before breakfast. May substitute to any manufacturer covered by patient's insurance.   rosuvastatin (CRESTOR) 10 MG tablet Take 1 tablet (10 mg total) by mouth daily.   Semaglutide,0.25 or 0.5MG /DOS, (OZEMPIC, 0.25 OR 0.5 MG/DOSE,) 2 MG/3ML SOPN Inject 0.25 mg into the skin once a week.   valsartan (DIOVAN) 40 MG tablet Take 1 tablet (40 mg total) by mouth daily.   VITAMIN D, CHOLECALCIFEROL, PO Take by mouth.   Respiratory Therapy Supplies (FULL KIT NEBULIZER SET) MISC 1 each by Does not apply route once for 1 dose.   Spacer/Aero-Holding Rudean Curt Use in combination with albuterol inhaler, following the instructions for the albuterol (Patient not taking: Reported on 02/19/2023)   No facility-administered encounter medications on file as of 02/24/2023.    Allergies (verified) Patient has no known allergies.   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 [facility])    Cataract 2024   Dr mentioned it at eye exam   Diabetes mellitus without complication (HCC)    Hyperlipidemia 2023  Hypertension 2023   Patient denies medical problems    Past Surgical History:  Procedure Laterality Date   ABDOMINAL HYSTERECTOMY     BREAST SURGERY     Family History  Problem Relation Age of Onset   Alcohol abuse Mother    Arthritis Mother    Hyperlipidemia Sister    Alcohol abuse Sister    Hypertension Son    Hyperlipidemia Son    Diabetes Son    ADD / ADHD Son    Asthma Son    ADD / ADHD Brother    Cancer  Brother    ADD / ADHD Sister    Alcohol abuse Sister    Cancer Sister    Alcohol abuse Sister    Alcohol abuse Brother    Early death Brother    Alcohol abuse Son    Early death Son    Alcohol abuse Daughter    Drug abuse Daughter    Early death Daughter    Cancer Sister    Early death Daughter    Social History   Socioeconomic History   Marital status: Divorced    Spouse name: Not on file   Number of children: 4   Years of education: Not on file   Highest education level: 8th grade  Occupational History   Not on file  Tobacco Use   Smoking status: Former    Current packs/day: 0.00    Average packs/day: 1.5 packs/day for 20.0 years (30.0 ttl pk-yrs)    Types: Cigarettes    Start date: 06/10/1972    Quit date: 06/10/1978    Years since quitting: 44.7   Smokeless tobacco: Never  Vaping Use   Vaping status: Never Used  Substance and Sexual Activity   Alcohol use: Yes    Alcohol/week: 2.0 standard drinks of alcohol    Types: 2 Cans of beer per week   Drug use: No   Sexual activity: Not Currently    Birth control/protection: Other-see comments, None    Comment: Hysterectomy  Other Topics Concern   Not on file  Social History Narrative   Not on file   Social Determinants of Health   Financial Resource Strain: High Risk (02/23/2023)   Overall Financial Resource Strain (CARDIA)    Difficulty of Paying Living Expenses: Very hard  Food Insecurity: Food Insecurity Present (02/23/2023)   Hunger Vital Sign    Worried About Running Out of Food in the Last Year: Often true    Ran Out of Food in the Last Year: Often true  Transportation Needs: No Transportation Needs (02/23/2023)   PRAPARE - Administrator, Civil Service (Medical): No    Lack of Transportation (Non-Medical): No  Physical Activity: Inactive (02/23/2023)   Exercise Vital Sign    Days of Exercise per Week: 0 days    Minutes of Exercise per Session: 0 min  Stress: No Stress Concern Present (02/23/2023)    [employer] of Occupational Health - Occupational Stress Questionnaire    Feeling of Stress : Only a little  Social Connections: Unknown (02/23/2023)   Social Connection and Isolation Panel [NHANES]    Frequency of Communication with Friends and Family: More than three times a week    Frequency of Social Gatherings with Friends and Family: Twice a week    Attends Religious Services: Not on Marketing executive or Organizations: No    Attends Banker Meetings: Patient declined    Marital Status:  Divorced    Tobacco Counseling Counseling given: Not Answered   Clinical Intake:  Pre-visit preparation completed: Yes  Pain : No/denies pain    BMI - recorded: 26.93 Nutritional Status: BMI 25 -29 Overweight Nutritional Risks: None Diabetes: Yes CBG done?: No Did pt. bring in CBG monitor from home?: No  How often do you need to have someone help you when you read instructions, pamphlets, or other written materials from your doctor or pharmacy?: 5 - Always  Interpreter Needed?: No  Comments: lives alone but family there all the time Information entered by :: B.Babita Amaker,LPN   Activities of Daily Living    02/23/2023    9:33 AM 10/10/2022   10:49 AM  In your present state of health, do you have any difficulty performing the following activities:  Hearing? 1 0  Vision? 0 1  Difficulty concentrating or making decisions? 1 1  Walking or climbing stairs? 1 0  Dressing or bathing? 0 0  Doing errands, shopping? 1 0  Preparing Food and eating ? Y   Using the Toilet? N   In the past six months, have you accidently leaked urine? Y   Do you have problems with loss of bowel control? Y   Managing your Medications? Y   Managing your Finances? N   Housekeeping or managing your Housekeeping? Y     Patient Care Team: Sherlyn Hay, DO as PCP - General (Family Medicine) Hulen Luster, RN as Oncology Nurse Navigator Pa, [facility]  [facility])  Indicate any recent Medical Services you may have received from other than [facility] providers in the past year (date may be approximate).     Assessment:   This is a routine wellness examination for Jajaira.  Hearing/Vision screen Hearing Screening - Comments:: Adequate Eye Vision Screening - Comments:: Adequate vision: [facility]   Goals Addressed   None    Depression Screen    02/24/2023   11:44 AM 01/21/2023    1:16 PM 10/10/2022   10:49 AM  PHQ 2/9 Scores  PHQ - 2 Score 0 0 0  PHQ- 9 Score   0    Fall Risk    02/23/2023    9:33 AM 01/21/2023    1:16 PM 10/10/2022   10:49 AM  Fall Risk   Falls in the past year? 0 0 0  Number falls in past yr: 0 0 0  Injury with Fall? 0 0 0  Risk for fall due to : No Fall Risks No Fall Risks   Follow up Education provided;Falls prevention discussed Falls evaluation completed     MEDICARE RISK AT HOME: Medicare Risk at Home Any stairs in or around the home?: No If so, are there any without handrails?: No Home free of loose throw rugs in walkways, pet beds, electrical cords, etc?: Yes Adequate lighting in your home to reduce risk of falls?: Yes Life alert?: No Use of a cane, walker or w/c?: No Grab bars in the bathroom?: No Shower chair or bench in shower?: No Elevated toilet seat or a handicapped toilet?: No  TIMED UP AND GO:  Was the test performed? No    Cognitive Function:        02/24/2023   11:46 AM 01/21/2023    1:33 PM  6CIT Screen  What Year? 0 points 0 points  What month? 0 points 0 points  What time? 0 points 0 points  Count back from 20 0 points 0 points  Months in reverse  4 points 4 points  Repeat phrase 6 points 10 points  Total Score 10 points 14 points    Immunizations Immunization History  Administered Date(s) Administered   Fluad Trivalent(High Dose 65+) 02/19/2023   PNEUMOCOCCAL CONJUGATE-20 01/15/2022    TDAP status: Up to date  Flu Vaccine status: Up to date  Pneumococcal vaccine  status: Up to date  Covid-19 vaccine status: Declined, Education has been provided regarding the importance of this vaccine but patient still declined. Advised may receive this vaccine at local pharmacy or Health Dept.or vaccine clinic. Aware to provide a copy of the vaccination record if obtained from local pharmacy or Health Dept. Verbalized acceptance and understanding.  Qualifies for Shingles Vaccine? Yes   Zostavax completed No   Shingrix Completed?: No.    Education has been provided regarding the importance of this vaccine. Patient has been advised to call insurance company to determine out of pocket expense if they have not yet received this vaccine. Advised may also receive vaccine at local pharmacy or Health Dept. Verbalized acceptance and understanding.  Screening Tests Health Maintenance  Topic Date Due   COVID-19 Vaccine (1) Never done   Zoster Vaccines- Shingrix (1 of 2) Never done   HEMOGLOBIN A1C  07/24/2023   FOOT EXAM  10/10/2023   Diabetic kidney evaluation - eGFR measurement  10/17/2023   Diabetic kidney evaluation - Urine ACR  10/17/2023   OPHTHALMOLOGY EXAM  11/27/2023   Medicare Annual Wellness (AWV)  02/24/2024   Pneumonia Vaccine 59+ Years old  Completed   INFLUENZA VACCINE  Completed   DEXA SCAN  Completed   Hepatitis C Screening  Completed   HPV VACCINES  Aged Out   DTaP/Tdap/Td  Discontinued    Health Maintenance  Health Maintenance Due  Topic Date Due   COVID-19 Vaccine (1) Never done   Zoster Vaccines- Shingrix (1 of 2) Never done    Colorectal cancer screening: No longer required.   Mammogram status: No longer required due to age.  Bone Density status: Completed yes. Results reflect: Bone density results: NORMAL. Repeat every 5 years.  Lung Cancer Screening: (Low Dose CT Chest recommended if Age 81-80 years, 20 pack-year currently smoking OR have quit w/in 15years.) does not qualify.   Lung Cancer Screening Referral: no  Additional  Screening:  Hepatitis C Screening: does not qualify; Completed no  Vision Screening: Recommended annual ophthalmology exams for early detection of glaucoma and other disorders of the eye. Is the patient up to date with their annual eye exam?  Yes  Who is the provider or what is the name of the office in which the patient attends annual eye exams? [facility] If pt is not established with a provider, would they like to be referred to a provider to establish care? No .   Dental Screening: Recommended annual dental exams for proper oral hygiene  Diabetic Foot Exam: Diabetic Foot Exam: Completed yes  Community Resource Referral / Chronic Care Management: CRR required this visit?  No   CCM required this visit?  No    Plan:     I have personally reviewed and noted the following in the patient's chart:   Medical and social history Use of alcohol, tobacco or illicit drugs  Current medications and supplements including opioid prescriptions. Patient is not currently taking opioid prescriptions. Functional ability and status Nutritional status Physical activity Advanced directives List of other physicians Hospitalizations, surgeries, and ER visits in previous 12 months Vitals Screenings to include cognitive, depression,  and falls Referrals and appointments  In addition, I have reviewed and discussed with patient certain preventive protocols, quality metrics, and best practice recommendations. A written personalized care plan for preventive services as well as general preventive health recommendations were provided to patient.     Sue Lush, LPN   1/61/0960   After Visit Summary: (MyChart) Due to this being a telephonic visit, the after visit summary with patients personalized plan was offered to patient via MyChart   Nurse Notes: The patient states she is doing well and has no concerns or questions at this time.

## 2023-02-24 NOTE — Patient Instructions (Signed)
Vicki West , Thank you for taking time to come for your Medicare Wellness Visit. I appreciate your ongoing commitment to your health goals. Please review the following plan we discussed and let me know if I can assist you in the future.   Referrals/Orders/Follow-Ups/Clinician Recommendations: none  This is a list of the screening recommended for you and due dates:  Health Maintenance  Topic Date Due   COVID-19 Vaccine (1) Never done   Zoster (Shingles) Vaccine (1 of 2) Never done   Hemoglobin A1C  07/24/2023   Complete foot exam   10/10/2023   Yearly kidney function blood test for diabetes  10/17/2023   Yearly kidney health urinalysis for diabetes  10/17/2023   Eye exam for diabetics  11/27/2023   Medicare Annual Wellness Visit  02/24/2024   Pneumonia Vaccine  Completed   Flu Shot  Completed   DEXA scan (bone density measurement)  Completed   Hepatitis C Screening  Completed   HPV Vaccine  Aged Out   DTaP/Tdap/Td vaccine  Discontinued    Advanced directives: (Declined) Advance directive discussed with you today. Even though you declined this today, please call our office should you change your mind, and we can give you the proper paperwork for you to fill out.  Next Medicare Annual Wellness Visit scheduled for next year: Yes 02/25/24 @ 10:15 am telephone

## 2023-02-25 ENCOUNTER — Encounter: Payer: Self-pay | Admitting: Family Medicine

## 2023-02-25 DIAGNOSIS — E1165 Type 2 diabetes mellitus with hyperglycemia: Secondary | ICD-10-CM

## 2023-03-03 MED ORDER — EMPAGLIFLOZIN 10 MG PO TABS
10.0000 mg | ORAL_TABLET | Freq: Every day | ORAL | 1 refills | Status: DC
Start: 2023-03-03 — End: 2023-07-09

## 2023-03-03 MED ORDER — SITAGLIPTIN PHOSPHATE 100 MG PO TABS: 100.0000 mg | ORAL_TABLET | Freq: Every day | ORAL | 1 refills | Status: DC

## 2023-03-25 ENCOUNTER — Ambulatory Visit: Payer: 59 | Attending: Student in an Organized Health Care Education/Training Program

## 2023-03-25 DIAGNOSIS — Z87891 Personal history of nicotine dependence: Secondary | ICD-10-CM | POA: Insufficient documentation

## 2023-03-25 DIAGNOSIS — R053 Chronic cough: Secondary | ICD-10-CM | POA: Insufficient documentation

## 2023-03-25 HISTORY — DX: Chronic cough: R05.3

## 2023-03-25 LAB — PULMONARY FUNCTION TEST ARMC ONLY
DL/VA % pred: 74 %
DL/VA: 3.08 ml/min/mmHg/L
DLCO unc % pred: 38 %
DLCO unc: 7.1 ml/min/mmHg
FEF 25-75 Post: 1.22 L/s
FEF 25-75 Pre: 1.41 L/s
FEF2575-%Change-Post: -13 %
FEF2575-%Pred-Post: 84 %
FEF2575-%Pred-Pre: 97 %
FEV1-%Change-Post: -3 %
FEV1-%Pred-Post: 78 %
FEV1-%Pred-Pre: 81 %
FEV1-Post: 1.5 L
FEV1-Pre: 1.56 L
FEV1FVC-%Change-Post: 2 %
FEV1FVC-%Pred-Pre: 101 %
FEV6-%Change-Post: -4 %
FEV6-%Pred-Post: 79 %
FEV6-%Pred-Pre: 83 %
FEV6-Post: 1.94 L
FEV6-Pre: 2.04 L
FEV6FVC-%Change-Post: 1 %
FEV6FVC-%Pred-Post: 105 %
FEV6FVC-%Pred-Pre: 104 %
FVC-%Change-Post: -6 %
FVC-%Pred-Post: 75 %
FVC-%Pred-Pre: 80 %
FVC-Post: 1.94 L
FVC-Pre: 2.07 L
Post FEV1/FVC ratio: 77 %
Post FEV6/FVC ratio: 100 %
Pre FEV1/FVC ratio: 76 %
Pre FEV6/FVC Ratio: 99 %
RV % pred: 90 %
RV: 2.09 L
TLC % pred: 80 %
TLC: 3.95 L

## 2023-03-25 MED ORDER — ALBUTEROL SULFATE (2.5 MG/3ML) 0.083% IN NEBU
2.5000 mg | INHALATION_SOLUTION | Freq: Once | RESPIRATORY_TRACT | Status: AC
  Administered 2023-03-25: 2.5 mg via RESPIRATORY_TRACT
  Filled 2023-03-25: qty 3

## 2023-04-02 ENCOUNTER — Ambulatory Visit: Payer: 59 | Admitting: Student in an Organized Health Care Education/Training Program

## 2023-04-03 ENCOUNTER — Ambulatory Visit (INDEPENDENT_AMBULATORY_CARE_PROVIDER_SITE_OTHER): Payer: 59 | Admitting: Student in an Organized Health Care Education/Training Program

## 2023-04-03 ENCOUNTER — Encounter: Payer: Self-pay | Admitting: Student in an Organized Health Care Education/Training Program

## 2023-04-03 VITALS — BP 90/60 | HR 76 | Temp 97.6°F | Ht 63.0 in | Wt 150.2 lb

## 2023-04-03 DIAGNOSIS — R053 Chronic cough: Secondary | ICD-10-CM

## 2023-04-03 NOTE — Progress Notes (Signed)
Synopsis: Referred in for cough by Sherlyn Hay, DO  Assessment & Plan:   #Chronic Cough  Vicki West is presenting for the evaluation of cough that has improved but not fully resolved. She had last seen me in September of 2024 for the chief complaint of chronic cough. Workup ordered included PFT that show normal spirometry and lung volumes. DLCO, while notably decreased, is unlikely to be an accurate measurement given significant discrepancy between [place] and [place].  Patient's symptoms are overall much better. Interventions have included switching from ACEi to ARB, and initiation of Cetirizine twice daily. Today, we discussed further workup for the patient's symptoms, which would include a double contrast esophagogram to assess for reflux disease, as well as a high resolution chest CT. I don't feel strongly about the chest CT as she's had previous imaging (CT neck) that showed a portion of the lung parenchyma to be normal without signs of ILD. Patient would prefer to hold off on further investigations for now, and will pursue a GERD consistent diet. Should symptoms persist, then we would proceed with further workup.  -GERD consistent diet -continue Cetirizine twice daily -re-assess symptoms on follow up  Return in about 3 months (around 07/04/2023).  I spent 30 minutes caring for this patient today, including preparing to see the patient, obtaining a medical history , reviewing a separately obtained history, performing a medically appropriate examination and/or evaluation, counseling and educating the patient/family/caregiver, and documenting clinical information in the electronic health record  Raechel Chute, MD [facility] 04/03/2023 2:56 PM    End of visit medications:  No orders of the defined types were placed in this encounter.    Current Outpatient Medications:    albuterol (VENTOLIN HFA) 108 (90 Base) MCG/ACT inhaler, Inhale 1-2 puffs into the lungs every  6 (six) hours as needed for shortness of breath., Disp: 1 each, Rfl: 1   anastrozole (ARIMIDEX) 1 MG tablet, Take 1 mg by mouth daily., Disp: , Rfl:    Blood Glucose Monitoring Suppl DEVI, 1 each by Does not apply route in the morning, at noon, and at bedtime. May substitute to any manufacturer covered by patient's insurance., Disp: 1 each, Rfl: 0   calcium carbonate (TUMS - DOSED IN MG ELEMENTAL CALCIUM) 500 MG chewable tablet, Chew 1 tablet by mouth daily., Disp: , Rfl:    empagliflozin (JARDIANCE) 10 MG TABS tablet, Take 1 tablet (10 mg total) by mouth daily. Take one tablet by mouth daily, Disp: 90 tablet, Rfl: 1   Glucose Blood (BLOOD GLUCOSE TEST STRIPS) STRP, 1 each by In Vitro route daily before breakfast. May substitute to any manufacturer covered by patient's insurance., Disp: 30 each, Rfl: 11   hydrochlorothiazide (HYDRODIURIL) 12.5 MG tablet, Take 1 tablet (12.5 mg total) by mouth daily., Disp: 90 tablet, Rfl: 3   Lancet Device MISC, 1 each by Does not apply route daily before breakfast. May substitute to any manufacturer covered by patient's insurance., Disp: 1 each, Rfl: 11   rosuvastatin (CRESTOR) 10 MG tablet, Take 1 tablet (10 mg total) by mouth daily., Disp: 90 tablet, Rfl: 3   sitaGLIPtin (JANUVIA) 100 MG tablet, Take 1 tablet (100 mg total) by mouth daily., Disp: 90 tablet, Rfl: 1   Spacer/Aero-Holding Chambers DEVI, Use in combination with albuterol inhaler, following the instructions for the albuterol, Disp: 1 each, Rfl: 0   valsartan (DIOVAN) 40 MG tablet, Take 1 tablet (40 mg total) by mouth daily., Disp: 90 tablet, Rfl: 3  VITAMIN D, CHOLECALCIFEROL, PO, Take by mouth., Disp: , Rfl:    ipratropium-albuterol (DUONEB) 0.5-2.5 (3) MG/3ML SOLN, Take 3 mLs by nebulization every 6 (six) hours as needed. (Patient not taking: Reported on 04/03/2023), Disp: 360 mL, Rfl: 1   Respiratory Therapy Supplies (FULL KIT NEBULIZER SET) MISC, 1 each by Does not apply route once for 1 dose.  (Patient not taking: Reported on 04/03/2023), Disp: 1 each, Rfl: 0   Subjective:   PATIENT ID: Vicki West GENDER: female DOB: 1945-08-03, MRN: [id number]  Chief Complaint  Patient presents with   Follow-up    Cough. No shortness of breath or wheezing.     HPI  Patient is a pleasant 77 year old female presenting to clinic for follow up on cough.  She had initially presented for a chronic cough of over one year in duration, occurring throughout the day without alleviating symptoms. The cough is dry and non-productive, without any hemoptysis. She does not have any associated wheeze, chest pain, chest tightness, fevers, chills, or weight changes.  Following our last visit, we initiated Cetirizine as well as obtained pulmonary function testing. Patient has also switched from ACEi to ARB. She reports that the cough is better, but is not fully resolved. Diet reviewed, she enjoys coffee. Continues to have some nasal drainage.     Patient does not have any personal history of lung disease nor does she have any history of asthma herself.  She does not have any symptoms similar to this in the past growing up.  She has family history of asthma and her son.   Patient does not have any pets and denies any current occupational exposures.  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  [place].  She does not have any birds.  She smoked for around 10 years but quit in the late 70s/early 80s and has around 10 pack years of smoking history.  Ancillary information including prior medications, full medical/surgical/family/social histories, and PFTs (when available) are listed below and have been reviewed.   Review of Systems  Constitutional:  Negative for chills, fever and weight loss.  Respiratory:  Positive for cough. Negative for hemoptysis, sputum production, shortness of breath and wheezing.   Cardiovascular:  Negative for chest pain,  palpitations, orthopnea, leg swelling and PND.     Objective:   Vitals:   04/03/23 1430  BP: 90/60  Pulse: 76  Temp: 97.6 F (36.4 C)  TempSrc: Temporal  SpO2: 97%  Weight: 150 lb 3.2 oz (68.1 kg)  Height: 5\' 3"  (1.6 m)   97% on RA BMI Readings from Last 3 Encounters:  04/03/23 26.61 kg/m  02/24/23 26.93 kg/m  02/19/23 26.96 kg/m   Wt Readings from Last 3 Encounters:  04/03/23 150 lb 3.2 oz (68.1 kg)  02/24/23 152 lb (68.9 kg)  02/19/23 152 lb 3.2 oz (69 kg)    Physical Exam Constitutional:      Appearance: Normal appearance.  Cardiovascular:     Rate and Rhythm: Normal rate and regular rhythm.     Pulses: Normal pulses.     Heart sounds: Normal heart sounds.  Pulmonary:     Effort: Pulmonary effort is normal.     Breath sounds: Normal breath sounds.  Musculoskeletal:     Right lower leg: No edema.     Left lower leg: No edema.  Neurological:     General: No focal deficit present.     Mental  Status: She is alert and oriented to person, place, and time. Mental status is at baseline.       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 [facility])    Cataract 2024   Dr mentioned it at eye exam   Diabetes mellitus without complication (HCC)    Hyperlipidemia 2023   Hypertension 2023   Patient denies medical problems      Family History  Problem Relation Age of Onset   Alcohol abuse Mother    Arthritis Mother    Hyperlipidemia Sister    Alcohol abuse Sister    Hypertension Son    Hyperlipidemia Son    Diabetes Son    ADD / ADHD Son    Asthma Son    ADD / ADHD Brother    Cancer Brother    ADD / ADHD Sister    Alcohol abuse Sister    Cancer Sister    Alcohol abuse Sister    Alcohol abuse Brother    Early death Brother    Alcohol abuse Son    Early death Son    Alcohol abuse Daughter    Drug abuse Daughter    Early death  Daughter    Cancer Sister    Early death Daughter      Past Surgical History:  Procedure Laterality Date   ABDOMINAL HYSTERECTOMY     BREAST SURGERY      Social History   Socioeconomic History   Marital status: Divorced    Spouse name: Not on file   Number of children: 4   Years of education: Not on file   Highest education level: 8th grade  Occupational History   Not on file  Tobacco Use   Smoking status: Former    Current packs/day: 0.00    Average packs/day: 1.5 packs/day for 20.0 years (30.0 ttl pk-yrs)    Types: Cigarettes    Start date: 06/10/1972    Quit date: 06/10/1978    Years since quitting: 44.8   Smokeless tobacco: Never  Vaping Use   Vaping status: Never Used  Substance and Sexual Activity   Alcohol use: Yes    Alcohol/week: 2.0 standard drinks of alcohol    Types: 2 Cans of beer per week   Drug use: No   Sexual activity: Not Currently    Birth control/protection: Other-see comments, None    Comment: Hysterectomy  Other Topics Concern   Not on file  Social History Narrative   Not on file   Social Determinants of Health   Financial Resource Strain: High Risk (02/23/2023)   Overall Financial Resource Strain (CARDIA)    Difficulty of Paying Living Expenses: Very hard  Food Insecurity: Food Insecurity Present (02/23/2023)   Hunger Vital Sign    Worried About Running Out of Food in the Last Year: Often true    Ran Out of Food in the Last Year: Often true  Transportation Needs: No Transportation Needs (02/23/2023)   PRAPARE - Administrator, Civil Service (Medical): No    Lack of Transportation (Non-Medical): No  Physical Activity: Inactive (02/23/2023)   Exercise Vital Sign    Days of Exercise per Week: 0 days    Minutes of Exercise per Session: 0 min  Stress: No Stress Concern Present (02/23/2023)   [employer] of Occupational Health - Occupational Stress  Questionnaire    Feeling of Stress : Only a little  Social Connections: Unknown  (02/23/2023)   Social Connection and Isolation Panel [NHANES]    Frequency of Communication with Friends and Family: More than three times a week    Frequency of Social Gatherings with Friends and Family: Twice a week    Attends Religious Services: Not on Insurance claims handler of Clubs or Organizations: No    Attends Banker Meetings: Patient declined    Marital Status: Divorced  Catering manager Violence: Not At Risk (02/24/2023)   Humiliation, Afraid, Rape, and Kick questionnaire    Fear of Current or Ex-Partner: No    Emotionally Abused: No    Physically Abused: No    Sexually Abused: No     No Known Allergies   CBC    Component Value Date/Time   WBC 7.0 01/30/2023 1707   RBC 3.86 (L) 01/30/2023 1707   HGB 11.7 (L) 01/30/2023 1707   HCT 35.8 (L) 01/30/2023 1707   PLT 226 01/30/2023 1707   MCV 92.7 01/30/2023 1707   MCH 30.3 01/30/2023 1707   MCHC 32.7 01/30/2023 1707   RDW 14.6 01/30/2023 1707    Pulmonary Functions Testing Results:    Latest Ref Rng & Units 03/25/2023    2:18 PM  PFT Results  FVC-Pre L 2.07   FVC-Predicted Pre % 80   FVC-Post L 1.94   FVC-Predicted Post % 75   Pre FEV1/FVC % % 76   Post FEV1/FCV % % 77   FEV1-Pre L 1.56   FEV1-Predicted Pre % 81   FEV1-Post L 1.50   DLCO uncorrected ml/min/mmHg 7.10   DLCO [facility]% % 38   DLVA Predicted % 74   TLC L 3.95   TLC % Predicted % 80   RV % Predicted % 90     Outpatient Medications Prior to Visit  Medication Sig Dispense Refill   albuterol (VENTOLIN HFA) 108 (90 Base) MCG/ACT inhaler Inhale 1-2 puffs into the lungs every 6 (six) hours as needed for shortness of breath. 1 each 1   anastrozole (ARIMIDEX) 1 MG tablet Take 1 mg by mouth daily.     Blood Glucose Monitoring Suppl DEVI 1 each by Does not apply route in the morning, at noon, and at bedtime. May substitute to any manufacturer covered by patient's insurance. 1 each 0   calcium carbonate (TUMS - DOSED IN MG ELEMENTAL CALCIUM) 500  MG chewable tablet Chew 1 tablet by mouth daily.     empagliflozin (JARDIANCE) 10 MG TABS tablet Take 1 tablet (10 mg total) by mouth daily. Take one tablet by mouth daily 90 tablet 1   Glucose Blood (BLOOD GLUCOSE TEST STRIPS) STRP 1 each by In Vitro route daily before breakfast. May substitute to any manufacturer covered by patient's insurance. 30 each 11   hydrochlorothiazide (HYDRODIURIL) 12.5 MG tablet Take 1 tablet (12.5 mg total) by mouth daily. 90 tablet 3   Lancet Device MISC 1 each by Does not apply route daily before breakfast. May substitute to any manufacturer covered by patient's insurance. 1 each 11   rosuvastatin (CRESTOR) 10 MG tablet Take 1 tablet (10 mg total) by mouth daily. 90 tablet 3   sitaGLIPtin (JANUVIA) 100 MG tablet Take 1 tablet (100 mg total) by mouth daily. 90 tablet 1   Spacer/Aero-Holding Chambers DEVI Use in combination with albuterol inhaler, following the instructions for the albuterol 1 each 0   valsartan (DIOVAN) 40 MG  tablet Take 1 tablet (40 mg total) by mouth daily. 90 tablet 3   VITAMIN D, CHOLECALCIFEROL, PO Take by mouth.     ipratropium-albuterol (DUONEB) 0.5-2.5 (3) MG/3ML SOLN Take 3 mLs by nebulization every 6 (six) hours as needed. (Patient not taking: Reported on 04/03/2023) 360 mL 1   Respiratory Therapy Supplies (FULL KIT NEBULIZER SET) MISC 1 each by Does not apply route once for 1 dose. (Patient not taking: Reported on 04/03/2023) 1 each 0   No facility-administered medications prior to visit.

## 2023-04-23 ENCOUNTER — Encounter: Payer: Self-pay | Admitting: Family Medicine

## 2023-04-23 ENCOUNTER — Ambulatory Visit: Payer: 59 | Admitting: Family Medicine

## 2023-04-23 VITALS — BP 85/55 | HR 78 | Ht 61.0 in | Wt 150.7 lb

## 2023-04-23 DIAGNOSIS — E1122 Type 2 diabetes mellitus with diabetic chronic kidney disease: Secondary | ICD-10-CM | POA: Diagnosis not present

## 2023-04-23 DIAGNOSIS — N182 Chronic kidney disease, stage 2 (mild): Secondary | ICD-10-CM | POA: Diagnosis not present

## 2023-04-23 DIAGNOSIS — Z7984 Long term (current) use of oral hypoglycemic drugs: Secondary | ICD-10-CM

## 2023-04-23 DIAGNOSIS — I152 Hypertension secondary to endocrine disorders: Secondary | ICD-10-CM

## 2023-04-23 DIAGNOSIS — D649 Anemia, unspecified: Secondary | ICD-10-CM

## 2023-04-23 DIAGNOSIS — E1169 Type 2 diabetes mellitus with other specified complication: Secondary | ICD-10-CM

## 2023-04-23 DIAGNOSIS — R053 Chronic cough: Secondary | ICD-10-CM

## 2023-04-23 DIAGNOSIS — E782 Mixed hyperlipidemia: Secondary | ICD-10-CM

## 2023-04-23 DIAGNOSIS — E1159 Type 2 diabetes mellitus with other circulatory complications: Secondary | ICD-10-CM

## 2023-04-23 HISTORY — DX: Chronic kidney disease, stage 2 (mild): N18.2

## 2023-04-23 HISTORY — DX: Anemia, unspecified: D64.9

## 2023-04-23 MED ORDER — VALSARTAN 40 MG PO TABS: 20.0000 mg | ORAL_TABLET | Freq: Every day | ORAL | 0 refills | Status: DC

## 2023-04-23 NOTE — Assessment & Plan Note (Signed)
Noted. No acute concerns.  Continue to monitor.

## 2023-04-23 NOTE — Assessment & Plan Note (Addendum)
Blood pressure low today, though patient is asymptomatic Continue hydrochlorothiazide 12.5 mg daily Decrease valsartan to 20 mg daily

## 2023-04-23 NOTE — Assessment & Plan Note (Signed)
Repeat lipid panel ordered today Continue rosuvastatin 10 mg daily

## 2023-04-23 NOTE — Assessment & Plan Note (Signed)
Currently being managed/evaluated by pulmonology; will defer to their management. Most recently seen 04/03/2023, follow a GERD-diet and continue cetirizine twice daily.

## 2023-04-23 NOTE — Assessment & Plan Note (Signed)
Hemoglobin 11.7 on 01/30/2023. Will recheck level today.

## 2023-04-23 NOTE — Assessment & Plan Note (Addendum)
Check A1c today On rosuvastatin and valsartan Continue empagliflozin (Jardiance) 10 mg daily Continue sitagliptin (Januvia) 100 mg daily

## 2023-04-23 NOTE — Patient Instructions (Addendum)
Plan to get Covid booster at your pharmacy.  Cut valsartan 40 mg tablets in half and take one half tablet daily.  Continue to check blood pressure and notify the clinic if her blood pressure starts to stay >130/80.

## 2023-04-23 NOTE — Progress Notes (Signed)
Established patient visit   Patient: Vicki West   DOB: Oct 17, 1945   77 y.o. Female  MRN: [id number] Visit Date: 04/23/2023  Today's healthcare provider: Sherlyn Hay, DO   Chief Complaint  Patient presents with   Medical Management of Chronic Issues    3 month follow-up. Patient reports taking medication as prescribed and no symptoms to repport. Patient reports she is tolerating medication well. [name] reports she has always had lower leg edema and it remains the same   Subjective    HPI mAWV 02/24/2023  Chronic cough  - some improvement after discontinuation of ACE inhibitor. She has been seeing a pulmonologist for this issue, who increased her Zyrtec dosage to twice daily. The patient has also been following a diet to reduce reflux.   Patient's blood pressure has been low over the last several visits, including at the pulmonologist's office. Despite this, the patient denies experiencing any dizziness, vision changes, lightheadedness, or headaches.  The patient also reports some swelling, but no other symptoms such as chest pain or shortness of breath. She has been on a regimen of lisinopril, hydrochlorothiazide, and valsartan, which is being considered for reduction due to the low blood pressure readings.  In addition, the patient has been prescribed glasses, which initially caused some eye alignment issues. However, these resolved after discontinuing the glasses for a period. The patient now wears the glasses for a few hours each day.  The patient also has a history of diabetes, but recent blood sugar readings have been checked irregularly as patient has trouble checking them herself and her daughter-in-law is unable to get over there on a daily basis to check a fasting blood sugar.  -Patient denies symptoms of low blood sugar  Lastly, the patient declined the shingles vaccine but has received the flu shot and is considering the COVID-19 booster.  She is also taking an over-the-counter vitamin D supplement.    Medications: Outpatient Medications Prior to Visit  Medication Sig   albuterol (VENTOLIN HFA) 108 (90 Base) MCG/ACT inhaler Inhale 1-2 puffs into the lungs every 6 (six) hours as needed for shortness of breath.   anastrozole (ARIMIDEX) 1 MG tablet Take 1 mg by mouth daily.   Blood Glucose Monitoring Suppl DEVI 1 each by Does not apply route in the morning, at noon, and at bedtime. May substitute to any manufacturer covered by patient's insurance.   calcium carbonate (TUMS - DOSED IN MG ELEMENTAL CALCIUM) 500 MG chewable tablet Chew 1 tablet by mouth daily.   empagliflozin (JARDIANCE) 10 MG TABS tablet Take 1 tablet (10 mg total) by mouth daily. Take one tablet by mouth daily   Glucose Blood (BLOOD GLUCOSE TEST STRIPS) STRP 1 each by In Vitro route daily before breakfast. May substitute to any manufacturer covered by patient's insurance.   hydrochlorothiazide (HYDRODIURIL) 12.5 MG tablet Take 1 tablet (12.5 mg total) by mouth daily.   ipratropium-albuterol (DUONEB) 0.5-2.5 (3) MG/3ML SOLN Take 3 mLs by nebulization every 6 (six) hours as needed.   Lancet Device MISC 1 each by Does not apply route daily before breakfast. May substitute to any manufacturer covered by patient's insurance.   rosuvastatin (CRESTOR) 10 MG tablet Take 1 tablet (10 mg total) by mouth daily.   sitaGLIPtin (JANUVIA) 100 MG tablet Take 1 tablet (100 mg total) by mouth daily.   Spacer/Aero-Holding Chambers DEVI Use in combination with albuterol inhaler, following the instructions for the albuterol   VITAMIN D, CHOLECALCIFEROL, PO  Take by mouth.   [DISCONTINUED] valsartan (DIOVAN) 40 MG tablet Take 1 tablet (40 mg total) by mouth daily.   Respiratory Therapy Supplies (FULL KIT NEBULIZER SET) MISC 1 each by Does not apply route once for 1 dose. (Patient not taking: Reported on 04/03/2023)   No facility-administered medications prior to visit.    Review of  Systems  Constitutional:  Negative for appetite change, chills, fatigue and fever.  HENT:  Negative for congestion.   Eyes:  Negative for visual disturbance.  Respiratory:  Positive for cough (Chronic, slightly improved). Negative for chest tightness and shortness of breath.   Cardiovascular:  Positive for leg swelling (Chronic, mild). Negative for chest pain and palpitations.  Gastrointestinal:  Negative for abdominal pain, nausea and vomiting.  Neurological:  Negative for dizziness, weakness, numbness and headaches.        Objective    BP (!) 85/55 (BP Location: Left Arm, Patient Position: Sitting, Cuff Size: Normal)   Pulse 78   Ht 5\' 1"  (1.549 m)   Wt 150 lb 11.2 oz (68.4 kg)   SpO2 99%   BMI 28.47 kg/m     Physical Exam Constitutional:      Appearance: Normal appearance.  HENT:     Head: Normocephalic and atraumatic.  Eyes:     General: No scleral icterus.    Extraocular Movements: Extraocular movements intact.     Conjunctiva/sclera: Conjunctivae normal.  Cardiovascular:     Rate and Rhythm: Normal rate and regular rhythm.     Pulses: Normal pulses.     Heart sounds: Normal heart sounds.  Pulmonary:     Effort: Pulmonary effort is normal. No respiratory distress.     Breath sounds: Normal breath sounds.  Abdominal:     General: Bowel sounds are normal. There is no distension.     Palpations: Abdomen is soft. There is no mass.     Tenderness: There is no abdominal tenderness. There is no guarding.  Musculoskeletal:     Right lower leg: No edema.     Left lower leg: No edema.  Skin:    General: Skin is warm and dry.  Neurological:     Mental Status: She is alert and oriented to person, place, and time. Mental status is at baseline.  Psychiatric:        Mood and Affect: Mood normal.        Behavior: Behavior normal.      No results found for any visits on 04/23/23.  Assessment & Plan    Hypertension associated with type 2 diabetes mellitus  (HCC) Assessment & Plan: Blood pressure low today, though patient is asymptomatic Continue hydrochlorothiazide 12.5 mg daily Decrease valsartan to 20 mg daily  Orders: -     Comprehensive metabolic panel -     Valsartan; Take 0.5 tablets (20 mg total) by mouth daily.  Dispense: 45 tablet; Refill: 0  Type 2 diabetes mellitus with stage 2 chronic kidney disease, without long-term current use of insulin (HCC) Assessment & Plan: Check A1c today On rosuvastatin and valsartan Continue empagliflozin (Jardiance) 10 mg daily Continue sitagliptin (Januvia) 100 mg daily  Orders: -     Hemoglobin A1c  Normocytic anemia -     CBC  Chronic kidney disease (CKD) stage G2/A2, mildly decreased glomerular filtration rate (GFR) between 60-89 mL/min/1.73 square meter and albuminuria creatinine ratio between 30-299 mg/g Assessment & Plan: Noted. No acute concerns.  Continue to monitor.    Mixed hyperlipidemia due to type 2  diabetes mellitus (HCC) Assessment & Plan: Repeat lipid panel ordered today Continue rosuvastatin 10 mg daily   Orders: -     Lipid panel  Chronic cough Assessment & Plan: Currently being managed/evaluated by pulmonology; will defer to their management. Most recently seen 04/03/2023, follow a GERD-diet and continue cetirizine twice daily.    Return in about 3 weeks (around 05/14/2023) for HTN, then CPE around 10/21/2022.      I discussed the assessment and treatment plan with the patient  The patient was provided an opportunity to ask questions and all were answered. The patient agreed with the plan and demonstrated an understanding of the instructions.   The patient was advised to call back or seek an in-person evaluation if the symptoms worsen or if the condition fails to improve as anticipated.    Sherlyn Hay, DO  [facility] [phone] (phone) [phone] (fax)  [facility]

## 2023-05-01 ENCOUNTER — Other Ambulatory Visit: Payer: Self-pay | Admitting: Family Medicine

## 2023-05-02 LAB — CBC
Hematocrit: 33.1 % — ABNORMAL LOW (ref 34.0–46.6)
Hemoglobin: 10.8 g/dL — ABNORMAL LOW (ref 11.1–15.9)
MCH: 30.4 pg (ref 26.6–33.0)
MCHC: 32.6 g/dL (ref 31.5–35.7)
MCV: 93 fL (ref 79–97)
Platelets: 259 10*3/uL (ref 150–450)
RBC: 3.55 x10E6/uL — ABNORMAL LOW (ref 3.77–5.28)
RDW: 12.8 % (ref 11.7–15.4)
WBC: 6.8 10*3/uL (ref 3.4–10.8)

## 2023-05-02 LAB — COMPREHENSIVE METABOLIC PANEL
ALT: 16 [IU]/L (ref 0–32)
AST: 19 [IU]/L (ref 0–40)
Albumin: 4.5 g/dL (ref 3.8–4.8)
Alkaline Phosphatase: 79 [IU]/L (ref 44–121)
BUN/Creatinine Ratio: 17 (ref 12–28)
BUN: 19 mg/dL (ref 8–27)
Bilirubin Total: 0.2 mg/dL (ref 0.0–1.2)
CO2: 25 mmol/L (ref 20–29)
Calcium: 10.2 mg/dL (ref 8.7–10.3)
Chloride: 101 mmol/L (ref 96–106)
Creatinine, Ser: 1.15 mg/dL — ABNORMAL HIGH (ref 0.57–1.00)
Globulin, Total: 3 g/dL (ref 1.5–4.5)
Glucose: 161 mg/dL — ABNORMAL HIGH (ref 70–99)
Potassium: 4.2 mmol/L (ref 3.5–5.2)
Sodium: 141 mmol/L (ref 134–144)
Total Protein: 7.5 g/dL (ref 6.0–8.5)
eGFR: 49 mL/min/{1.73_m2} — ABNORMAL LOW (ref >59)

## 2023-05-02 LAB — HEMOGLOBIN A1C
Est. average glucose Bld gHb Est-mCnc: 200 mg/dL
Hgb A1c MFr Bld: 8.6 % — ABNORMAL HIGH (ref 4.8–5.6)

## 2023-05-02 LAB — LIPID PANEL
Chol/HDL Ratio: 2.9 ratio (ref 0.0–4.4)
Cholesterol, Total: 157 mg/dL (ref 100–199)
HDL: 54 mg/dL (ref >39)
LDL Chol Calc (NIH): 91 mg/dL (ref 0–99)
Triglycerides: 61 mg/dL (ref 0–149)
VLDL Cholesterol Cal: 12 mg/dL (ref 5–40)

## 2023-05-13 ENCOUNTER — Ambulatory Visit: Payer: 59 | Admitting: Family Medicine

## 2023-05-13 NOTE — Progress Notes (Unsigned)
Established patient visit   Patient: Vicki West   DOB: 12/30/1945   77 y.o. Female  MRN: [id number] Visit Date: 05/13/2023  Today's healthcare provider: Sherlyn Hay, DO   No chief complaint on file.  Subjective    HPI   *** Hypertension, follow-up  BP Readings from Last 3 Encounters:  04/23/23 (!) 85/55  04/03/23 90/60  02/19/23 (!) 92/58   Wt Readings from Last 3 Encounters:  04/23/23 150 lb 11.2 oz (68.4 kg)  04/03/23 150 lb 3.2 oz (68.1 kg)  02/24/23 152 lb (68.9 kg)     She was last seen for hypertension 3 weeks ago.  BP at that visit was 85/55. Management since that visit includes continued hydrochlorothiazide 12.5 mg daily and decreased valsartan to 20 mg daily.  She reports {excellent/good/fair/poor:19665} compliance with treatment. She {is/is not:9024} having side effects. {document side effects if present:1}  Use of agents associated with hypertension: {bp agents assoc with hypertension:511::"none"}.   Outside blood pressures are {***enter patient reported home BP readings, or 'not being checked':1}. Symptoms: No chest pain No chest pressure  No palpitations No syncope  No dyspnea No orthopnea  No paroxysmal nocturnal dyspnea {Yes/No:20286} lower extremity edema   Pertinent labs Lab Results  Component Value Date   CHOL 157 05/01/2023   HDL 54 05/01/2023   LDLCALC 91 05/01/2023   TRIG 61 05/01/2023   CHOLHDL 2.9 05/01/2023   Lab Results  Component Value Date   NA 141 05/01/2023   K 4.2 05/01/2023   CREATININE 1.15 (H) 05/01/2023   EGFR 49 (L) 05/01/2023   GLUCOSE 161 (H) 05/01/2023     The 10-year ASCVD risk score (Arnett DK, et al., 2019) is: 32.6%  ---------------------------------------------------------------------------------------------------   {History (Optional):23778}  Medications: Outpatient Medications Prior to Visit  Medication Sig   albuterol (VENTOLIN HFA) 108 (90 Base) MCG/ACT inhaler Inhale 1-2 puffs into the  lungs every 6 (six) hours as needed for shortness of breath.   anastrozole (ARIMIDEX) 1 MG tablet Take 1 mg by mouth daily.   Blood Glucose Monitoring Suppl DEVI 1 each by Does not apply route in the morning, at noon, and at bedtime. May substitute to any manufacturer covered by patient's insurance.   calcium carbonate (TUMS - DOSED IN MG ELEMENTAL CALCIUM) 500 MG chewable tablet Chew 1 tablet by mouth daily.   empagliflozin (JARDIANCE) 10 MG TABS tablet Take 1 tablet (10 mg total) by mouth daily. Take one tablet by mouth daily   Glucose Blood (BLOOD GLUCOSE TEST STRIPS) STRP 1 each by In Vitro route daily before breakfast. May substitute to any manufacturer covered by patient's insurance.   hydrochlorothiazide (HYDRODIURIL) 12.5 MG tablet Take 1 tablet (12.5 mg total) by mouth daily.   ipratropium-albuterol (DUONEB) 0.5-2.5 (3) MG/3ML SOLN Take 3 mLs by nebulization every 6 (six) hours as needed.   Lancet Device MISC 1 each by Does not apply route daily before breakfast. May substitute to any manufacturer covered by patient's insurance.   Respiratory Therapy Supplies (FULL KIT NEBULIZER SET) MISC 1 each by Does not apply route once for 1 dose. (Patient not taking: Reported on 04/03/2023)   rosuvastatin (CRESTOR) 10 MG tablet Take 1 tablet (10 mg total) by mouth daily.   sitaGLIPtin (JANUVIA) 100 MG tablet Take 1 tablet (100 mg total) by mouth daily.   Spacer/Aero-Holding Chambers DEVI Use in combination with albuterol inhaler, following the instructions for the albuterol   valsartan (DIOVAN) 40 MG tablet Take 0.5 tablets (  20 mg total) by mouth daily.   VITAMIN D, CHOLECALCIFEROL, PO Take by mouth.   No facility-administered medications prior to visit.    Review of Systems  ***  {Insert previous labs (optional):23779} {See past labs  Heme  Chem  Endocrine  Serology  Results Review (optional):1}   Objective    There were no vitals taken for this visit. {Insert last BP/Wt  (optional):23777}{See vitals history (optional):1}   Physical Exam    No results found for any visits on 05/13/23.  Assessment & Plan    There are no diagnoses linked to this encounter.   ***  No follow-ups on file.      I discussed the assessment and treatment plan with the patient  The patient was provided an opportunity to ask questions and all were answered. The patient agreed with the plan and demonstrated an understanding of the instructions.   The patient was advised to call back or seek an in-person evaluation if the symptoms worsen or if the condition fails to improve as anticipated.    Sherlyn Hay, DO  [facility] [phone] (phone) [phone] (fax)  [facility]

## 2023-05-25 ENCOUNTER — Other Ambulatory Visit: Payer: Self-pay | Admitting: Family Medicine

## 2023-05-25 DIAGNOSIS — N182 Chronic kidney disease, stage 2 (mild): Secondary | ICD-10-CM

## 2023-05-25 DIAGNOSIS — D649 Anemia, unspecified: Secondary | ICD-10-CM

## 2023-06-10 ENCOUNTER — Other Ambulatory Visit
Admission: RE | Admit: 2023-06-10 | Discharge: 2023-06-10 | Disposition: A | Discharge status: Home / Self Care | Payer: 59 | Attending: Family Medicine | Admitting: Family Medicine

## 2023-06-10 DIAGNOSIS — D649 Anemia, unspecified: Secondary | ICD-10-CM | POA: Diagnosis present

## 2023-06-10 DIAGNOSIS — N182 Chronic kidney disease, stage 2 (mild): Secondary | ICD-10-CM | POA: Insufficient documentation

## 2023-06-10 LAB — CBC WITH DIFFERENTIAL/PLATELET
Abs Immature Granulocytes: 0.04 10*3/uL (ref 0.00–0.07)
Basophils Absolute: 0 10*3/uL (ref 0.0–0.1)
Basophils Relative: 0 %
Eosinophils Absolute: 0.1 10*3/uL (ref 0.0–0.5)
Eosinophils Relative: 2 %
HCT: 35.2 % — ABNORMAL LOW (ref 36.0–46.0)
Hemoglobin: 11.7 g/dL — ABNORMAL LOW (ref 12.0–15.0)
Immature Granulocytes: 1 %
Lymphocytes Relative: 32 %
Lymphs Abs: 2.7 10*3/uL (ref 0.7–4.0)
MCH: 30.2 pg (ref 26.0–34.0)
MCHC: 33.2 g/dL (ref 30.0–36.0)
MCV: 91 fL (ref 80.0–100.0)
Monocytes Absolute: 0.6 10*3/uL (ref 0.1–1.0)
Monocytes Relative: 7 %
Neutro Abs: 4.9 10*3/uL (ref 1.7–7.7)
Neutrophils Relative %: 58 %
Platelets: 289 10*3/uL (ref 150–400)
RBC: 3.87 MIL/uL (ref 3.87–5.11)
RDW: 13.7 % (ref 11.5–15.5)
WBC: 8.5 10*3/uL (ref 4.0–10.5)
nRBC: 0 % (ref 0.0–0.2)

## 2023-06-10 LAB — BASIC METABOLIC PANEL
Anion gap: 14 (ref 5–15)
BUN: 23 mg/dL (ref 8–23)
CO2: 26 mmol/L (ref 22–32)
Calcium: 9.7 mg/dL (ref 8.9–10.3)
Chloride: 97 mmol/L — ABNORMAL LOW (ref 98–111)
Creatinine, Ser: 1.57 mg/dL — ABNORMAL HIGH (ref 0.44–1.00)
GFR, Estimated: 34 mL/min — ABNORMAL LOW (ref >60)
Glucose, Bld: 118 mg/dL — ABNORMAL HIGH (ref 70–99)
Potassium: 3.8 mmol/L (ref 3.5–5.1)
Sodium: 137 mmol/L (ref 135–145)

## 2023-06-13 ENCOUNTER — Telehealth: Payer: Self-pay | Admitting: Family Medicine

## 2023-06-13 ENCOUNTER — Encounter: Payer: Self-pay | Admitting: Family Medicine

## 2023-06-13 ENCOUNTER — Other Ambulatory Visit: Payer: Self-pay | Admitting: Family Medicine

## 2023-06-13 DIAGNOSIS — N182 Chronic kidney disease, stage 2 (mild): Secondary | ICD-10-CM

## 2023-06-13 LAB — CBC AND DIFFERENTIAL
HCT: 36 (ref 36–46)
Hemoglobin: 11.8 — AB (ref 12.0–16.0)
Neutrophils Absolute: 4
Platelets: 255 10*3/uL (ref 150–400)
WBC: 7.6

## 2023-06-13 LAB — CBC: RBC: 3.91 (ref 3.87–5.11)

## 2023-06-13 NOTE — Telephone Encounter (Signed)
 Called patient's son, Jerel, and discussed recommendations of stopping Jardiance , increasing fluid intake and potentially starting the patient on nightly insulin and/or referring her to endocrinology.  Also discussed referring patient to nephrology for specialist input regarding her kidney function.  Spoke with patient and recommended she go to the ER due to worsening kidney function, as it is unclear if stopping the Jardiance  will do enough to prevent ongoing worsening and potential kidney failure.  Also spoke with patient's daughter-in-law, as she manages a lot of the patient's care and accompanies her to appointments: She stated that they did reduce her BP med (cut it in half) and that her blood pressures have been 110s-120s/70s.  Given this and the worsening kidney function, I also emphasized to her that the patient does need to go to the emergency department for further evaluation and treatment, rather than waiting until an appointment is available.  Called patient's son back again to update him on the recommendation for patient to be seen in the emergency department.  The patient, her daughter-in-law and her son were all offered the opportunity to ask questions and denied having any questions at this point.

## 2023-06-18 ENCOUNTER — Encounter: Payer: Self-pay | Admitting: Family Medicine

## 2023-07-07 ENCOUNTER — Ambulatory Visit: Payer: 59 | Admitting: Student in an Organized Health Care Education/Training Program

## 2023-07-09 ENCOUNTER — Ambulatory Visit (INDEPENDENT_AMBULATORY_CARE_PROVIDER_SITE_OTHER): Payer: 59 | Admitting: Family Medicine

## 2023-07-09 VITALS — BP 132/85 | HR 65 | Resp 18 | Ht 61.0 in | Wt 149.5 lb

## 2023-07-09 DIAGNOSIS — E1122 Type 2 diabetes mellitus with diabetic chronic kidney disease: Secondary | ICD-10-CM | POA: Diagnosis not present

## 2023-07-09 DIAGNOSIS — I152 Hypertension secondary to endocrine disorders: Secondary | ICD-10-CM

## 2023-07-09 DIAGNOSIS — E1159 Type 2 diabetes mellitus with other circulatory complications: Secondary | ICD-10-CM

## 2023-07-09 DIAGNOSIS — N182 Chronic kidney disease, stage 2 (mild): Secondary | ICD-10-CM

## 2023-07-09 DIAGNOSIS — C50912 Malignant neoplasm of unspecified site of left female breast: Secondary | ICD-10-CM

## 2023-07-09 DIAGNOSIS — Z130 Encounter for screening for diseases of the blood and blood-forming organs and certain disorders involving the immune mechanism: Secondary | ICD-10-CM

## 2023-07-09 DIAGNOSIS — R053 Chronic cough: Secondary | ICD-10-CM

## 2023-07-09 DIAGNOSIS — E782 Mixed hyperlipidemia: Secondary | ICD-10-CM

## 2023-07-09 DIAGNOSIS — J9809 Other diseases of bronchus, not elsewhere classified: Secondary | ICD-10-CM

## 2023-07-09 DIAGNOSIS — E1169 Type 2 diabetes mellitus with other specified complication: Secondary | ICD-10-CM | POA: Diagnosis not present

## 2023-07-09 DIAGNOSIS — Z7984 Long term (current) use of oral hypoglycemic drugs: Secondary | ICD-10-CM

## 2023-07-09 DIAGNOSIS — E1165 Type 2 diabetes mellitus with hyperglycemia: Secondary | ICD-10-CM

## 2023-07-09 DIAGNOSIS — Z1329 Encounter for screening for other suspected endocrine disorder: Secondary | ICD-10-CM

## 2023-07-09 MED ORDER — ALBUTEROL SULFATE HFA 108 (90 BASE) MCG/ACT IN AERS: 1.0000 | INHALATION_SPRAY | Freq: Four times a day (QID) | RESPIRATORY_TRACT | 1 refills | Status: DC | PRN

## 2023-07-09 MED ORDER — SITAGLIPTIN PHOSPHATE 100 MG PO TABS: 100.0000 mg | ORAL_TABLET | Freq: Every day | ORAL | 3 refills | Status: DC

## 2023-07-09 MED ORDER — ROSUVASTATIN CALCIUM 20 MG PO TABS: 20.0000 mg | ORAL_TABLET | Freq: Every day | ORAL | 3 refills | Status: DC

## 2023-07-09 MED ORDER — VALSARTAN 40 MG PO TABS
20.0000 mg | ORAL_TABLET | Freq: Every day | ORAL | 0 refills | Status: DC
Start: 2023-07-09 — End: 2023-08-28

## 2023-07-09 MED ORDER — EMPAGLIFLOZIN 10 MG PO TABS: 10.0000 mg | ORAL_TABLET | Freq: Every day | ORAL | 3 refills | Status: DC

## 2023-07-09 MED ORDER — HYDROCHLOROTHIAZIDE 12.5 MG PO TABS
12.5000 mg | ORAL_TABLET | Freq: Every day | ORAL | 3 refills | Status: AC
Start: 2023-07-09 — End: ?

## 2023-07-09 MED ORDER — EXEMESTANE 25 MG PO TABS: 25.0000 mg | ORAL_TABLET | Freq: Every day | ORAL | 0 refills | Status: AC

## 2023-07-09 NOTE — Progress Notes (Signed)
Established patient visit   Patient: Vicki West   DOB: Jan 27, 1946   78 y.o. Female  MRN: [id number] Visit Date: 07/09/2023  Today's healthcare provider: Sherlyn Hay, DO   Chief Complaint  Patient presents with   Hospitalization Follow-up   Subjective    HPI The patient is a 78 year old female with hypertension and diabetes who presents for follow-up after an ER visit, which I directed her to for evaluation of possible acute kidney injury. She was also evaluated for abdominal pain, which has since resolved. The ER workup was overall unremarkable, and her kidney function had returned to baseline.  Her blood pressure has improved with the current regimen of valsartan 20 mg daily and hydrochlorothiazide 12.5 mg daily. Her daughter-in-law is cutting the 40 mg valsartan tablets in half. Her cough has resolved since discontinuing the lisinopril.  She is managing her diabetes with Jardiance and Januvia. Blood sugar levels have been inconsistent due to timing issues with testing, as she often tests later in the day after meals.  She is on a statin for cholesterol management, which has been consistent since her last check-up. She has not experienced any issues with her cholesterol medication.  She has been drinking more water recently and typically consumes coffee in the morning, along with zero-calorie sodas and Gatorade Zero.  She has been experiencing fatigue but denies any changes in her hair, skin, or nails. She continues to take vitamin D supplements.  No chest pain, shortness of breath, nausea, vomiting, dizziness, or vision changes.     Medications: Outpatient Medications Prior to Visit  Medication Sig   Blood Glucose Monitoring Suppl DEVI 1 each by Does not apply route in the morning, at noon, and at bedtime. May substitute to any manufacturer covered by patient's insurance.   calcium carbonate (TUMS - DOSED IN MG ELEMENTAL CALCIUM) 500 MG chewable tablet Chew 1  tablet by mouth daily.   Glucose Blood (BLOOD GLUCOSE TEST STRIPS) STRP 1 each by In Vitro route daily before breakfast. May substitute to any manufacturer covered by patient's insurance.   Lancet Device MISC 1 each by Does not apply route daily before breakfast. May substitute to any manufacturer covered by patient's insurance.   Spacer/Aero-Holding Chambers DEVI Use in combination with albuterol inhaler, following the instructions for the albuterol   VITAMIN D, CHOLECALCIFEROL, PO Take by mouth.   [DISCONTINUED] albuterol (VENTOLIN HFA) 108 (90 Base) MCG/ACT inhaler Inhale 1-2 puffs into the lungs every 6 (six) hours as needed for shortness of breath.   [DISCONTINUED] anastrozole (ARIMIDEX) 1 MG tablet Take 1 mg by mouth daily.   [DISCONTINUED] empagliflozin (JARDIANCE) 10 MG TABS tablet Take 1 tablet (10 mg total) by mouth daily. Take one tablet by mouth daily   [DISCONTINUED] exemestane (AROMASIN) 25 MG tablet Take 25 mg by mouth daily after breakfast.   [DISCONTINUED] hydrochlorothiazide (HYDRODIURIL) 12.5 MG tablet Take 1 tablet (12.5 mg total) by mouth daily.   [DISCONTINUED] ipratropium-albuterol (DUONEB) 0.5-2.5 (3) MG/3ML SOLN Take 3 mLs by nebulization every 6 (six) hours as needed.   [DISCONTINUED] rosuvastatin (CRESTOR) 10 MG tablet Take 1 tablet (10 mg total) by mouth daily.   [DISCONTINUED] sitaGLIPtin (JANUVIA) 100 MG tablet Take 1 tablet (100 mg total) by mouth daily.   [DISCONTINUED] valsartan (DIOVAN) 40 MG tablet Take 0.5 tablets (20 mg total) by mouth daily.   Respiratory Therapy Supplies (FULL KIT NEBULIZER SET) MISC 1 each by Does not apply route once for 1 dose. (  Patient not taking: Reported on 04/03/2023)   No facility-administered medications prior to visit.        Objective    BP 132/85   Pulse 65   Resp 18   Ht 5\' 1"  (1.549 m)   Wt 149 lb 8 oz (67.8 kg)   SpO2 99%   BMI 28.25 kg/m     Physical Exam Vitals and nursing note reviewed.  Constitutional:       General: She is not in acute distress.    Appearance: Normal appearance.  HENT:     Head: Normocephalic and atraumatic.  Eyes:     General: No scleral icterus.    Conjunctiva/sclera: Conjunctivae normal.  Cardiovascular:     Rate and Rhythm: Normal rate.  Pulmonary:     Effort: Pulmonary effort is normal.  Neurological:     Mental Status: She is alert and oriented to person, place, and time. Mental status is at baseline.  Psychiatric:        Mood and Affect: Mood normal.        Behavior: Behavior normal.      No results found for any visits on 07/09/23.  Assessment & Plan    Chronic kidney disease (CKD) stage G2/A2, mildly decreased glomerular filtration rate (GFR) between 60-89 mL/min/1.73 square meter and albuminuria creatinine ratio between 30-299 mg/g -     Basic metabolic panel  Hypertension associated with type 2 diabetes mellitus (HCC) Assessment & Plan: Well-controlled with valsartan and hydrochlorothiazide. She is cutting the valsartan 40 mg tablets in half to achieve the desired dose. Blood pressure readings are stable. Discussed the option of prescribing a lower dose tablet but decided to continue the current regimen due to stability and patient preference.   - Continue valsartan 20 mg (cutting 40 mg tablets in half)   - Continue hydrochlorothiazide   - Recheck blood pressure in three months    Orders: -     Basic metabolic panel -     hydroCHLOROthiazide; Take 1 tablet (12.5 mg total) by mouth daily.  Dispense: 90 tablet; Refill: 3 -     Valsartan; Take 0.5 tablets (20 mg total) by mouth daily.  Dispense: 45 tablet; Refill: 0  Mixed hyperlipidemia due to type 2 diabetes mellitus (HCC) Assessment & Plan: Managed with a statin. Considering increasing the statin dose to further reduce cardiovascular risk. Discussed the potential benefits and the importance of monitoring cholesterol levels.   - Increase statin dose   - Recheck cholesterol panel in three months     Orders: -     Lipid panel -     Rosuvastatin Calcium; Take 1 tablet (20 mg total) by mouth daily.  Dispense: 90 tablet; Refill: 3  Type 2 diabetes mellitus with stage 2 chronic kidney disease, without long-term current use of insulin (HCC) Assessment & Plan: Managed with Jardiance and Januvia. Blood sugar monitoring has been inconsistent due to her schedule. Emphasized the importance of checking blood sugar levels at least two hours after meals or before dinner for effective management.   - Continue Jardiance   - Continue Januvia   - Emphasize consistent blood sugar monitoring, especially two hours after meals or before dinner   - Recheck A1c in three months   Orders: -     Hemoglobin A1c -     Empagliflozin; Take 1 tablet (10 mg total) by mouth daily. Take one tablet by mouth daily  Dispense: 90 tablet; Refill: 3 -     SITagliptin Phosphate;  Take 1 tablet (100 mg total) by mouth daily.  Dispense: 90 tablet; Refill: 3  Screening for endocrine, metabolic and immunity disorder -     TSH + free T4  Recurrent bronchospasm Assessment & Plan: Refill sent today.  Orders: -     Albuterol Sulfate HFA; Inhale 1-2 puffs into the lungs every 6 (six) hours as needed for shortness of breath.  Dispense: 1 each; Refill: 1  Invasive ductal carcinoma of left breast (HCC) -     Exemestane; Take 1 tablet (25 mg total) by mouth daily after breakfast.  Dispense: 90 tablet; Refill: 0  Chronic cough Assessment & Plan: Improved after discontinuing lisinopril. Advised to avoid caffeine to reduce reflux symptoms, which may contribute to coughing.   - Continue avoiding caffeine   - Monitor for any recurrence of cough      General Health Maintenance   Discussed COVID booster and general hydration. Advised to maintain adequate fluid intake. Explained that the COVID booster is recommended annually and can be obtained at a pharmacy.   - Encourage getting the COVID booster at a pharmacy   - Continue  adequate hydration    Follow-up   - Schedule follow-up appointment in three months   - Order blood work one week prior to the next appointment   - Switch pharmacy from [facility] to [facility] for medication refills   - Send prescriptions for Jardiance, Januvia, statin, and albuterol to [facility]   - Send a 90-day supply of exemestane to [facility].   Return in about 3 months (around 10/07/2023) for Chronic f/u.      I discussed the assessment and treatment plan with the patient  The patient was provided an opportunity to ask questions and all were answered. The patient agreed with the plan and demonstrated an understanding of the instructions.   The patient was advised to call back or seek an in-person evaluation if the symptoms worsen or if the condition fails to improve as anticipated.    Sherlyn Hay, DO  [facility] [phone] (phone) [phone] (fax)  [facility]

## 2023-07-13 ENCOUNTER — Encounter: Payer: Self-pay | Admitting: Family Medicine

## 2023-07-13 NOTE — Assessment & Plan Note (Signed)
 Refill sent today.

## 2023-07-13 NOTE — Assessment & Plan Note (Signed)
Improved after discontinuing lisinopril. Advised to avoid caffeine to reduce reflux symptoms, which may contribute to coughing.   - Continue avoiding caffeine   - Monitor for any recurrence of cough

## 2023-07-13 NOTE — Assessment & Plan Note (Signed)
Well-controlled with valsartan and hydrochlorothiazide. She is cutting the valsartan 40 mg tablets in half to achieve the desired dose. Blood pressure readings are stable. Discussed the option of prescribing a lower dose tablet but decided to continue the current regimen due to stability and patient preference.   - Continue valsartan 20 mg (cutting 40 mg tablets in half)   - Continue hydrochlorothiazide   - Recheck blood pressure in three months

## 2023-07-13 NOTE — Assessment & Plan Note (Signed)
Managed with Jardiance and Januvia. Blood sugar monitoring has been inconsistent due to her schedule. Emphasized the importance of checking blood sugar levels at least two hours after meals or before dinner for effective management.   - Continue Jardiance   - Continue Januvia   - Emphasize consistent blood sugar monitoring, especially two hours after meals or before dinner   - Recheck A1c in three months

## 2023-07-13 NOTE — Assessment & Plan Note (Signed)
Managed with a statin. Considering increasing the statin dose to further reduce cardiovascular risk. Discussed the potential benefits and the importance of monitoring cholesterol levels.   - Increase statin dose   - Recheck cholesterol panel in three months

## 2023-08-28 ENCOUNTER — Other Ambulatory Visit: Payer: Self-pay | Admitting: Family Medicine

## 2023-08-28 DIAGNOSIS — E1159 Type 2 diabetes mellitus with other circulatory complications: Secondary | ICD-10-CM

## 2023-10-06 ENCOUNTER — Encounter: Payer: Self-pay | Admitting: Family Medicine

## 2023-10-21 ENCOUNTER — Encounter: Payer: Self-pay | Admitting: Family Medicine

## 2023-10-21 ENCOUNTER — Ambulatory Visit: Payer: Self-pay | Admitting: Family Medicine

## 2023-10-21 VITALS — BP 120/74 | HR 74 | Temp 98.8°F | Ht 61.0 in | Wt 146.9 lb

## 2023-10-21 DIAGNOSIS — E1122 Type 2 diabetes mellitus with diabetic chronic kidney disease: Secondary | ICD-10-CM | POA: Diagnosis not present

## 2023-10-21 DIAGNOSIS — D649 Anemia, unspecified: Secondary | ICD-10-CM

## 2023-10-21 DIAGNOSIS — I152 Hypertension secondary to endocrine disorders: Secondary | ICD-10-CM

## 2023-10-21 DIAGNOSIS — E782 Mixed hyperlipidemia: Secondary | ICD-10-CM

## 2023-10-21 DIAGNOSIS — N182 Chronic kidney disease, stage 2 (mild): Secondary | ICD-10-CM

## 2023-10-21 DIAGNOSIS — E1159 Type 2 diabetes mellitus with other circulatory complications: Secondary | ICD-10-CM

## 2023-10-21 DIAGNOSIS — E1169 Type 2 diabetes mellitus with other specified complication: Secondary | ICD-10-CM | POA: Diagnosis not present

## 2023-10-21 DIAGNOSIS — C50912 Malignant neoplasm of unspecified site of left female breast: Secondary | ICD-10-CM | POA: Diagnosis not present

## 2023-10-21 MED ORDER — VALSARTAN 40 MG PO TABS: 20.0000 mg | ORAL_TABLET | Freq: Every day | ORAL | 2 refills | Status: AC

## 2023-10-21 NOTE — Progress Notes (Signed)
 Complete physical exam   Patient: Vicki West   DOB: Jun 12, 1945   78 y.o. Female  MRN: [id number] Visit Date: 10/21/2023  Today's healthcare provider: Carlean Charter, DO   Chief Complaint  Patient presents with   Annual Exam    Diet -  General, well balanced Exercise - None Feeling - well Sleeping - well Concerns - none to report. AWV scheduled w/[facility] 02/2024    Subjective    Vicki West is a 78 y.o. female who presents today for a complete physical exam.   HPI HPI     Annual Exam    Additional comments: Diet -  General, well balanced Exercise - None Feeling - well Sleeping - well Concerns - none to report. AWV scheduled w/[facility] 02/2024       Last edited by Pasty Bongo, CMA on 10/21/2023  3:29 PM.       Vicki West is a 78 year old female with diabetes and hypertension who presents for a routine follow-up and blood work. She is accompanied by her daughter.  She manages her diabetes and hypertension with Jardiance , Januvia , rosuvastatin , valsartan , and hydrochlorothiazide , along with calcium  and vitamin D  supplements. She has no issues with her current medication regimen but has not been checking her blood sugars regularly.  (Her daughter-in-law usually checks her blood sugars and has difficulty doing so regularly due to living in different part of town).  She experiences occasional leg swelling and alternating episodes of constipation and diarrhea, with no clear dietary correlation.  She has a history of seasonal allergies and previously experienced a cough, which was thought to be related to lisinopril . She no longer takes lisinopril  and uses albuterol  as needed, especially during allergy season. No history of asthma.  She has a history of smoking, having quit over fifteen years ago, and drinks alcohol occasionally, typically one or two beers at a time. She lives apart from her daughter-in-law, who assists with her care. No recent issues with balance or  falls. She regularly checks her feet for any problems and denies any recent wounds or issues with healing. Reports itching in feet.    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 [facility])    Cataract 2024   Dr mentioned it at eye exam   Chronic cough 03/25/2023   Chronic kidney disease (CKD) stage G2/A2, mildly decreased glomerular filtration rate (GFR) between 60-89 mL/min/1.73 square meter and albuminuria creatinine ratio between 30-299 mg/g 04/23/2023   Diabetes mellitus without complication (HCC)    Hyperlipidemia 2023   Hypertension 2023   Normocytic anemia 04/23/2023   Patient denies medical problems    Recurrent bronchospasm 10/17/2022   Wheezing 01/21/2023   Past Surgical History:  Procedure Laterality Date   ABDOMINAL HYSTERECTOMY     BREAST SURGERY     Social History   Socioeconomic History   Marital status: Divorced    Spouse name: Not on file   Number of children: 4   Years of education: Not on file   Highest education level: 8th grade  Occupational History   Not on file  Tobacco Use   Smoking status: Former    Current packs/day: 0.00    Average packs/day: 1.5 packs/day for 20.0 years (30.0 ttl pk-yrs)    Types: Cigarettes    Start date: 06/10/1972    Quit date: 06/10/1978  Years since quitting: 45.4   Smokeless tobacco: Never  Vaping Use   Vaping status: Never Used  Substance and Sexual Activity   Alcohol use: Yes    Alcohol/week: 2.0 standard drinks of alcohol    Types: 2 Cans of beer per week   Drug use: No   Sexual activity: Not Currently    Birth control/protection: Other-see comments, None    Comment: Hysterectomy  Other Topics Concern   Not on file  Social History Narrative   Not on file   Social Drivers of Health   Financial Resource Strain: High Risk (02/23/2023)   Overall Financial Resource Strain (CARDIA)    Difficulty of Paying Living  Expenses: Very hard  Food Insecurity: No Food Insecurity (10/21/2023)   Hunger Vital Sign    Worried About Running Out of Food in the Last Year: Never true    Ran Out of Food in the Last Year: Never true  Transportation Needs: No Transportation Needs (02/23/2023)   PRAPARE - Administrator, Civil Service (Medical): No    Lack of Transportation (Non-Medical): No  Physical Activity: Inactive (02/23/2023)   Exercise Vital Sign    Days of Exercise per Week: 0 days    Minutes of Exercise per Session: 0 min  Stress: No Stress Concern Present (02/23/2023)   [employer] of Occupational Health - Occupational Stress Questionnaire    Feeling of Stress : Only a little  Social Connections: Unknown (02/23/2023)   Social Connection and Isolation Panel [NHANES]    Frequency of Communication with Friends and Family: More than three times a week    Frequency of Social Gatherings with Friends and Family: Twice a week    Attends Religious Services: Not on file    Active Member of Clubs or Organizations: No    Attends Banker Meetings: Patient declined    Marital Status: Divorced  Catering manager Violence: Not At Risk (02/24/2023)   Humiliation, Afraid, Rape, and Kick questionnaire    Fear of Current or Ex-Partner: No    Emotionally Abused: No    Physically Abused: No    Sexually Abused: No   Family Status  Relation Name Status   Mother Mozel Deceased   Father  Deceased   Sister Kitty Perkins (Not Specified)   Brother Therapist, nutritional (Not Specified)   Sister Surveyor, mining (Not Specified)   Sister Press photographer (Not Specified)   Brother Ace Abu (Not Specified)   Son Emeterio Hansen (Not Specified)   Daughter [place]  (Not Specified)   Sister Arzella Laurence (Not Specified)   Daughter Bridgette Campus (Not Specified)  No partnership data on file   Family History  Problem Relation Age of Onset   Alcohol abuse Mother    Arthritis Mother    Hyperlipidemia Sister    Alcohol abuse Sister    Hypertension  Son    Hyperlipidemia Son    Diabetes Son    ADD / ADHD Son    Asthma Son    ADD / ADHD Brother    Cancer Brother    ADD / ADHD Sister    Alcohol abuse Sister    Cancer Sister    Alcohol abuse Sister    Alcohol abuse Brother    Early death Brother    Alcohol abuse Son    Early death Son    Alcohol abuse Daughter    Drug abuse Daughter    Early death Daughter    Cancer Sister    Early death Daughter  No Known Allergies  Patient Care Team: Aylanie Cubillos, Asencion Blacksmith, DO as PCP - General (Family Medicine) Waverly Hageman, RN as Oncology Nurse Navigator Pa, [facility])   Medications: Outpatient Medications Prior to Visit  Medication Sig   albuterol  (VENTOLIN  HFA) 108 (90 Base) MCG/ACT inhaler Inhale 1-2 puffs into the lungs every 6 (six) hours as needed for shortness of breath.   Blood Glucose Monitoring Suppl DEVI 1 each by Does not apply route in the morning, at noon, and at bedtime. May substitute to any manufacturer covered by patient's insurance.   calcium  carbonate (TUMS - DOSED IN MG ELEMENTAL CALCIUM ) 500 MG chewable tablet Chew 1 tablet by mouth daily.   empagliflozin  (JARDIANCE ) 10 MG TABS tablet Take 1 tablet (10 mg total) by mouth daily. Take one tablet by mouth daily   exemestane  (AROMASIN ) 25 MG tablet Take 1 tablet (25 mg total) by mouth daily after breakfast.   hydrochlorothiazide  (HYDRODIURIL ) 12.5 MG tablet Take 1 tablet (12.5 mg total) by mouth daily.   rosuvastatin  (CRESTOR ) 20 MG tablet Take 1 tablet (20 mg total) by mouth daily.   sitaGLIPtin  (JANUVIA ) 100 MG tablet Take 1 tablet (100 mg total) by mouth daily.   Spacer/Aero-Holding Ismael Maria Use in combination with albuterol  inhaler, following the instructions for the albuterol    VITAMIN D , CHOLECALCIFEROL, PO Take by mouth.   [DISCONTINUED] valsartan  (DIOVAN ) 40 MG tablet TAKE 1/2 TABLET BY MOUTH DAILY   Respiratory Therapy Supplies (FULL KIT NEBULIZER SET) MISC 1 each by Does not apply route once  for 1 dose. (Patient not taking: Reported on 04/03/2023)   No facility-administered medications prior to visit.    Review of Systems  Constitutional:  Negative for chills, fatigue and fever.  HENT:  Negative for congestion, ear pain, rhinorrhea, sneezing and sore throat.   Eyes: Negative.  Negative for pain, redness and visual disturbance.  Respiratory:  Negative for cough, shortness of breath and wheezing.   Cardiovascular:  Negative for chest pain and leg swelling.  Gastrointestinal:  Negative for abdominal pain, blood in stool, constipation, diarrhea and nausea.  Endocrine: Negative for polydipsia and polyphagia.  Genitourinary: Negative.  Negative for dysuria, flank pain, hematuria, pelvic pain, vaginal bleeding and vaginal discharge.  Musculoskeletal:  Negative for arthralgias, back pain, gait problem and joint swelling.  Skin:  Negative for rash.  Neurological: Negative.  Negative for dizziness, tremors, seizures, weakness, light-headedness, numbness and headaches.  Hematological:  Negative for adenopathy.  Psychiatric/Behavioral: Negative.  Negative for behavioral problems, confusion and dysphoric mood. The patient is not nervous/anxious and is not hyperactive.       Objective    BP 120/74 (BP Location: Right Arm, Patient Position: Sitting, Cuff Size: Normal)   Pulse 74   Temp 98.8 F (37.1 C) (Oral)   Ht 5\' 1"  (1.549 m)   Wt 146 lb 14.4 oz (66.6 kg)   SpO2 97%   BMI 27.76 kg/m    Physical Exam Constitutional:      Appearance: Normal appearance.  HENT:     Head: Normocephalic and atraumatic.  Eyes:     General: No scleral icterus.    Conjunctiva/sclera: Conjunctivae normal.  Cardiovascular:     Pulses:          Dorsalis pedis pulses are 2+ on the right side and 2+ on the left side.       Posterior tibial pulses are 2+ on the right side and 2+ on the left side.  Musculoskeletal:  Right foot: Normal range of motion. No deformity, bunion, Charcot foot, foot drop  or prominent metatarsal heads.     Left foot: Normal range of motion. No deformity, bunion, Charcot foot, foot drop or prominent metatarsal heads.  Feet:     Right foot:     Protective Sensation: 10 sites tested.  10 sites sensed.     Skin integrity: No ulcer, blister, skin breakdown, erythema, warmth, callus, dry skin or fissure.     Toenail Condition: Right toenails are normal.     Left foot:     Protective Sensation: 10 sites tested.  10 sites sensed.     Skin integrity: No ulcer, blister, skin breakdown, erythema, warmth, callus, dry skin or fissure.     Toenail Condition: Left toenails are normal.  Neurological:     Mental Status: She is alert and oriented to person, place, and time. Mental status is at baseline.  Psychiatric:        Mood and Affect: Mood normal.        Behavior: Behavior normal.      Last depression screening scores    10/21/2023    3:29 PM 07/09/2023    3:20 PM 04/23/2023    2:44 PM  PHQ 2/9 Scores  PHQ - 2 Score 0 0 0  PHQ- 9 Score  0    Last fall risk screening    10/21/2023    3:29 PM  Fall Risk   Falls in the past year? 0  Number falls in past yr: 0  Risk for fall due to : No Fall Risks  Follow up Falls evaluation completed   Last Audit-C alcohol use screening    10/21/2023    3:52 PM  Alcohol Use Disorder Test (AUDIT)  1. How often do you have a drink containing alcohol? 0  2. How many drinks containing alcohol do you have on a typical day when you are drinking? 0  3. How often do you have six or more drinks on one occasion? 0  AUDIT-C Score 0   A score of 3 or more in women, and 4 or more in men indicates increased risk for alcohol abuse, EXCEPT if all of the points are from question 1   Results for orders placed or performed in visit on 10/21/23  Microalbumin / creatinine urine ratio  Result Value Ref Range   Creatinine, Urine 39.4 Not Estab. mg/dL   Microalbumin, Urine 6.0 Not Estab. ug/mL   Microalb/Creat Ratio 15 0 - 29 mg/g creat   Comprehensive metabolic panel with GFR  Result Value Ref Range   Glucose 140 (H) 70 - 99 mg/dL   BUN 23 8 - 27 mg/dL   Creatinine, Ser 1.61 (H) 0.57 - 1.00 mg/dL   eGFR 53 (L) >09 UE/AVW/0.98   BUN/Creatinine Ratio 22 12 - 28   Sodium 139 134 - 144 mmol/L   Potassium 4.1 3.5 - 5.2 mmol/L   Chloride 99 96 - 106 mmol/L   CO2 26 20 - 29 mmol/L   Calcium  10.3 8.7 - 10.3 mg/dL   Total Protein 7.6 6.0 - 8.5 g/dL   Albumin 4.6 3.8 - 4.8 g/dL   Globulin, Total 3.0 1.5 - 4.5 g/dL   Bilirubin Total 0.2 0.0 - 1.2 mg/dL   Alkaline Phosphatase 80 44 - 121 IU/L   AST 22 0 - 40 IU/L   ALT 20 0 - 32 IU/L  Hemoglobin A1c  Result Value Ref Range   Hgb A1c MFr  Bld 9.1 (H) 4.8 - 5.6 %   Est. average glucose Bld gHb Est-mCnc 214 mg/dL  Lipid panel  Result Value Ref Range   Cholesterol, Total 164 100 - 199 mg/dL   Triglycerides 63 0 - 149 mg/dL   HDL 63 >82 mg/dL   VLDL Cholesterol Cal 12 5 - 40 mg/dL   LDL Chol Calc (NIH) 89 0 - 99 mg/dL   Chol/HDL Ratio 2.6 0.0 - 4.4 ratio    Assessment & Plan    Routine Health Maintenance and Physical Exam  Exercise Activities and Dietary recommendations  Goals   None     Immunization History  Administered Date(s) Administered   Fluad Trivalent(High Dose 65+) 02/19/2023   PNEUMOCOCCAL CONJUGATE-20 01/15/2022    Health Maintenance  Topic Date Due   COVID-19 Vaccine (1) 02/09/2024 (Originally 06/19/1950)   OPHTHALMOLOGY EXAM  11/27/2023   INFLUENZA VACCINE  01/09/2024   Medicare Annual Wellness (AWV)  02/24/2024   HEMOGLOBIN A1C  04/22/2024   Diabetic kidney evaluation - eGFR measurement  10/20/2024   Diabetic kidney evaluation - Urine ACR  10/20/2024   FOOT EXAM  10/20/2024   Pneumonia Vaccine 1+ Years old  Completed   DEXA SCAN  Completed   Hepatitis C Screening  Completed   HPV VACCINES  Aged Out   Meningococcal B Vaccine  Aged Out   DTaP/Tdap/Td  Discontinued   Zoster Vaccines- Shingrix  Discontinued    Discussed health benefits of  physical activity, and encouraged her to engage in regular exercise appropriate for her age and condition.   Type 2 diabetes mellitus with stage 2 chronic kidney disease, without long-term current use of insulin (HCC) -     Microalbumin / creatinine urine ratio -     Comprehensive metabolic panel with GFR -     Hemoglobin A1c  Hypertension associated with type 2 diabetes mellitus (HCC) -     Valsartan ; Take 0.5 tablets (20 mg total) by mouth daily.  Dispense: 45 tablet; Refill: 2  Invasive ductal carcinoma of left breast (HCC)  Mixed hyperlipidemia due to type 2 diabetes mellitus (HCC) -     Lipid panel  Normocytic anemia    Type 2 diabetes mellitus with stage 2 chronic kidney disease Managed with Jardiance  and Januvia . Blood sugar levels stable, no hypoglycemia or falls. Emphasized balanced diet and increased physical activity. If A1c slightly exceeds 8 and stable, focus on diet and activity. - Order A1c test. - Continue Jardiance  and Januvia . - Encourage balanced diet and increased physical activity. - Schedule follow-up in 4.5 months.  Hypertension Well-controlled with valsartan  and hydrochlorothiazide . Current blood pressure 120/74. - Continue valsartan  and hydrochlorothiazide . - Align medication refill dates with other prescriptions.  Invasive ductal carcinoma of left breast Continues to follow with oncology.  Taking exemestane  25 mg daily.  Defer to specialist management.  Mixed hyperlipidemia Continue rosuvastatin  20 mg daily  Normocytic anemia Chronic, well-managed. Advised to consume iron-rich foods to maintain hemoglobin levels. - Encourage consumption of iron-rich foods.    Return in about 5 months (around 03/08/2024) for Chronic f/u.     I discussed the assessment and treatment plan with the patient  The patient was provided an opportunity to ask questions and all were answered. The patient agreed with the plan and demonstrated an understanding of the  instructions.   The patient was advised to call back or seek an in-person evaluation if the symptoms worsen or if the condition fails to improve as anticipated.  Carlean Charter, DO  [facility] [phone] (phone) [phone] (fax)  [facility]

## 2023-10-22 LAB — HEMOGLOBIN A1C
Est. average glucose Bld gHb Est-mCnc: 214 mg/dL
Hgb A1c MFr Bld: 9.1 % — ABNORMAL HIGH (ref 4.8–5.6)

## 2023-10-22 LAB — LIPID PANEL
Chol/HDL Ratio: 2.6 ratio (ref 0.0–4.4)
Cholesterol, Total: 164 mg/dL (ref 100–199)
HDL: 63 mg/dL (ref >39)
LDL Chol Calc (NIH): 89 mg/dL (ref 0–99)
Triglycerides: 63 mg/dL (ref 0–149)
VLDL Cholesterol Cal: 12 mg/dL (ref 5–40)

## 2023-10-22 LAB — COMPREHENSIVE METABOLIC PANEL WITH GFR
ALT: 20 IU/L (ref 0–32)
AST: 22 IU/L (ref 0–40)
Albumin: 4.6 g/dL (ref 3.8–4.8)
Alkaline Phosphatase: 80 IU/L (ref 44–121)
BUN/Creatinine Ratio: 22 (ref 12–28)
BUN: 23 mg/dL (ref 8–27)
Bilirubin Total: 0.2 mg/dL (ref 0.0–1.2)
CO2: 26 mmol/L (ref 20–29)
Calcium: 10.3 mg/dL (ref 8.7–10.3)
Chloride: 99 mmol/L (ref 96–106)
Creatinine, Ser: 1.06 mg/dL — ABNORMAL HIGH (ref 0.57–1.00)
Globulin, Total: 3 g/dL (ref 1.5–4.5)
Glucose: 140 mg/dL — ABNORMAL HIGH (ref 70–99)
Potassium: 4.1 mmol/L (ref 3.5–5.2)
Sodium: 139 mmol/L (ref 134–144)
Total Protein: 7.6 g/dL (ref 6.0–8.5)
eGFR: 53 mL/min/{1.73_m2} — ABNORMAL LOW (ref >59)

## 2023-10-22 LAB — MICROALBUMIN / CREATININE URINE RATIO
Creatinine, Urine: 39.4 mg/dL
Microalb/Creat Ratio: 15 mg/g{creat} (ref 0–29)
Microalbumin, Urine: 6 ug/mL

## 2023-10-23 ENCOUNTER — Encounter: Payer: Self-pay | Admitting: Family Medicine

## 2023-10-28 ENCOUNTER — Ambulatory Visit: Payer: Self-pay | Admitting: Family Medicine

## 2023-10-28 DIAGNOSIS — E1122 Type 2 diabetes mellitus with diabetic chronic kidney disease: Secondary | ICD-10-CM

## 2023-10-28 DIAGNOSIS — N182 Chronic kidney disease, stage 2 (mild): Secondary | ICD-10-CM

## 2023-10-28 MED ORDER — EMPAGLIFLOZIN 25 MG PO TABS: 25.0000 mg | ORAL_TABLET | Freq: Every day | ORAL | 3 refills | Status: AC

## 2023-11-05 ENCOUNTER — Ambulatory Visit: Payer: Self-pay | Admitting: Family Medicine

## 2023-11-11 ENCOUNTER — Ambulatory Visit: Payer: 59 | Admitting: Family Medicine

## 2024-01-12 LAB — HM MAMMOGRAPHY

## 2024-02-25 ENCOUNTER — Ambulatory Visit: Payer: Self-pay

## 2024-03-02 ENCOUNTER — Ambulatory Visit: Admitting: Family Medicine

## 2024-04-05 ENCOUNTER — Encounter: Payer: Self-pay | Admitting: Family Medicine

## 2024-05-19 ENCOUNTER — Ambulatory Visit: Admitting: Emergency Medicine

## 2024-05-19 VITALS — Ht 63.0 in | Wt 145.0 lb

## 2024-05-19 DIAGNOSIS — Z0000 Encounter for general adult medical examination without abnormal findings: Secondary | ICD-10-CM

## 2024-05-19 NOTE — Progress Notes (Signed)
 Chief Complaint  Patient presents with   Medicare Wellness     Subjective:   Vicki West is a 78 y.o. female who presents for a Medicare Annual Wellness Visit.  Visit info / Clinical Intake: Medicare Wellness Visit Type:: Subsequent Annual Wellness Visit Persons participating in visit and providing information:: patient Medicare Wellness Visit Mode:: Telephone If telephone:: video declined Since this visit was completed virtually, some vitals may be partially provided or unavailable. Missing vitals are due to the limitations of the virtual format.: Documented vitals are patient reported If Telephone or Video please confirm:: I connected with patient using audio/video enable telemedicine. I verified patient identity with two identifiers, discussed telehealth limitations, and patient agreed to proceed. Patient Location:: home Provider Location:: home office Interpreter Needed?: No Pre-visit prep was completed: yes AWV questionnaire completed by patient prior to visit?: no Living arrangements:: (!) lives alone Patient's Overall Health Status Rating: good Typical amount of pain: none Does pain affect daily life?: no Are you currently prescribed opioids?: no  Dietary Habits and Nutritional Risks How many meals a day?: 2 Eats fruit and vegetables daily?: yes Most meals are obtained by: preparing own meals In the last 2 weeks, have you had any of the following?: none Diabetic:: (!) yes Any non-healing wounds?: no How often do you check your BS?: as needed (every other week) Would you like to be referred to a Nutritionist or for Diabetic Management? : no  Functional Status Activities of Daily Living (to include ambulation/medication): Independent Ambulation: Independent with device- listed below Home Assistive Devices/Equipment: Eyeglasses Medication Administration: Independent Home Management (perform basic housework or laundry): Independent Manage your own finances?:  yes Primary transportation is: family / friends Concerns about vision?: no *vision screening is required for WTM* Concerns about hearing?: no  Fall Screening Falls in the past year?: 0 Number of falls in past year: 0 Was there an injury with Fall?: 0 Fall Risk Category Calculator: 0 Patient Fall Risk Level: Low Fall Risk  Fall Risk Patient at Risk for Falls Due to: No Fall Risks Fall risk Follow up: Falls evaluation completed  Home and Transportation Safety: All rugs have non-skid backing?: yes All stairs or steps have railings?: (!) no (1 step without handrail) Grab bars in the bathtub or shower?: (!) no Have non-skid surface in bathtub or shower?: yes Good home lighting?: yes Regular seat belt use?: yes Hospital stays in the last year:: no  Cognitive Assessment Difficulty concentrating, remembering, or making decisions? : yes (sometimes forgets) Will 6CIT or Mini Cog be Completed: yes What year is it?: 0 points What month is it?: 0 points Give patient an address phrase to remember (5 components): [address] [place] About what time is it?: 0 points Count backwards from 20 to 1: 0 points Say the months of the year in reverse: 4 points Repeat the address phrase from earlier: 8 points 6 CIT Score: 12 points  Advance Directives (For Healthcare) Does Patient Have a Medical Advance Directive?: No Would patient like information on creating a medical advance directive?: No - Patient declined  Reviewed/Updated  Reviewed/Updated: Reviewed All (Medical, Surgical, Family, Medications, Allergies, Care Teams, Patient Goals)    Allergies (verified) Patient has no known allergies.   Current Medications (verified) Outpatient Encounter Medications as of 05/19/2024  Medication Sig   Blood Glucose Monitoring Suppl DEVI 1 each by Does not apply route in the morning, at noon, and at bedtime. May substitute to any manufacturer covered by patient's insurance.  calcium  carbonate  (TUMS - DOSED IN MG ELEMENTAL CALCIUM ) 500 MG chewable tablet Chew 1 tablet by mouth daily.   empagliflozin  (JARDIANCE ) 25 MG TABS tablet Take 1 tablet (25 mg total) by mouth daily before breakfast.   exemestane  (AROMASIN ) 25 MG tablet Take 1 tablet (25 mg total) by mouth daily after breakfast.   hydrochlorothiazide  (HYDRODIURIL ) 12.5 MG tablet Take 1 tablet (12.5 mg total) by mouth daily.   rosuvastatin  (CRESTOR ) 20 MG tablet Take 1 tablet (20 mg total) by mouth daily.   sitaGLIPtin  (JANUVIA ) 100 MG tablet Take 1 tablet (100 mg total) by mouth daily.   valsartan  (DIOVAN ) 40 MG tablet Take 0.5 tablets (20 mg total) by mouth daily.   VITAMIN D , CHOLECALCIFEROL, PO Take by mouth.   albuterol  (VENTOLIN  HFA) 108 (90 Base) MCG/ACT inhaler Inhale 1-2 puffs into the lungs every 6 (six) hours as needed for shortness of breath. (Patient not taking: Reported on 05/19/2024)   Respiratory Therapy Supplies (FULL KIT NEBULIZER SET) MISC 1 each by Does not apply route once for 1 dose. (Patient not taking: Reported on 05/19/2024)   Spacer/Aero-Holding Raguel FRENCH Use in combination with albuterol  inhaler, following the instructions for the albuterol  (Patient not taking: Reported on 05/19/2024)   No facility-administered encounter medications on file as of 05/19/2024.    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 [facility])    Cataract 2024   Dr mentioned it at eye exam   Chronic cough 03/25/2023   Chronic kidney disease (CKD) stage G2/A2, mildly decreased glomerular filtration rate (GFR) between 60-89 mL/min/1.73 square meter and albuminuria creatinine ratio between 30-299 mg/g 04/23/2023   Diabetes mellitus without complication (HCC)    Hyperlipidemia 2023   Hypertension 2023   Normocytic anemia 04/23/2023   Patient denies medical problems    Recurrent bronchospasm 10/17/2022   Wheezing 01/21/2023    Past Surgical History:  Procedure Laterality Date   ABDOMINAL HYSTERECTOMY     BREAST SURGERY     Family History  Problem Relation Age of Onset   Alcohol abuse Mother    Arthritis Mother    Hyperlipidemia Sister    Alcohol abuse Sister    Hypertension Son    Hyperlipidemia Son    Diabetes Son    ADD / ADHD Son    Asthma Son    ADD / ADHD Brother    Cancer Brother    ADD / ADHD Sister    Alcohol abuse Sister    Cancer Sister    Alcohol abuse Sister    Alcohol abuse Brother    Early death Brother    Alcohol abuse Son    Early death Son    Alcohol abuse Daughter    Drug abuse Daughter    Early death Daughter    Cancer Sister    Early death Daughter    Social History   Occupational History   Occupation: retired  Tobacco Use   Smoking status: Former    Current packs/day: 0.00    Average packs/day: 1.5 packs/day for 6.0 years (9.0 ttl pk-yrs)    Types: Cigarettes    Start date: 06/10/1972    Quit date: 06/10/1978    Years since quitting: 45.9   Smokeless tobacco: Never  Vaping Use   Vaping status: Never Used  Substance and Sexual Activity   Alcohol use: Yes    Alcohol/week:  2.0 standard drinks of alcohol    Types: 2 Cans of beer per week    Comment: 2 beers once a week or less   Drug use: No   Sexual activity: Not Currently    Birth control/protection: Other-see comments, None    Comment: Hysterectomy   Tobacco Counseling Counseling given: Not Answered  SDOH Screenings   Food Insecurity: No Food Insecurity (05/19/2024)  Housing: Low Risk  (05/19/2024)  Transportation Needs: No Transportation Needs (05/19/2024)  Utilities: Not At Risk (05/19/2024)  Alcohol Screen: Low Risk  (10/21/2023)  Depression (PHQ2-9): Low Risk  (05/19/2024)  Financial Resource Strain: High Risk (02/23/2023)  Physical Activity: Insufficiently Active (05/19/2024)  Social Connections: Moderately Isolated (05/19/2024)  Stress: No Stress Concern Present (05/19/2024)  Tobacco Use:  Medium Risk (05/19/2024)  Health Literacy: Inadequate Health Literacy (05/19/2024)   See flowsheets for full screening details  Depression Screen PHQ 2 & 9 Depression Scale- Over the past 2 weeks, how often have you been bothered by any of the following problems? Little interest or pleasure in doing things: 0 Feeling down, depressed, or hopeless (PHQ Adolescent also includes...irritable): 0 PHQ-2 Total Score: 0 Trouble falling or staying asleep, or sleeping too much: 0 Feeling tired or having little energy: 0 Poor appetite or overeating (PHQ Adolescent also includes...weight loss): 0 Feeling bad about yourself - or that you are a failure or have let yourself or your family down: 0 Trouble concentrating on things, such as reading the newspaper or watching television (PHQ Adolescent also includes...like school work): 0 Moving or speaking so slowly that other people could have noticed. Or the opposite - being so fidgety or restless that you have been moving around a lot more than usual: 0 Thoughts that you would be better off dead, or of hurting yourself in some way: 0 PHQ-9 Total Score: 0 If you checked off any problems, how difficult have these problems made it for you to do your work, take care of things at home, or get along with other people?: Not difficult at all  Depression Treatment Depression Interventions/Treatment : EYV7-0 Score <4 Follow-up Not Indicated     Goals Addressed               This Visit's Progress     Exercise 150 min/wk Moderate Activity (pt-stated)               Objective:    Today's Vitals   05/19/24 1401  Weight: 145 lb (65.8 kg)  Height: 5' 3 (1.6 m)   Body mass index is 25.69 kg/m.  Hearing/Vision screen Hearing Screening - Comments:: Denies hearing loss  Vision Screening - Comments:: Patient states she is UTD for DM eye exam at [facility] [place]. I have requested the most recent eye exam from them. Immunizations and Health  Maintenance Health Maintenance  Topic Date Due   COVID-19 Vaccine (1) Never done   OPHTHALMOLOGY EXAM  11/27/2023   Influenza Vaccine  01/09/2024   HEMOGLOBIN A1C  04/22/2024   Bone Density Scan  07/04/2024   Diabetic kidney evaluation - eGFR measurement  10/20/2024   Diabetic kidney evaluation - Urine ACR  10/20/2024   FOOT EXAM  10/20/2024   Mammogram  01/11/2025   Medicare Annual Wellness (AWV)  05/19/2025   Pneumococcal Vaccine: 50+ Years  Completed   Hepatitis C Screening  Completed   Meningococcal B Vaccine  Aged Out   DTaP/Tdap/Td  Discontinued   Zoster Vaccines- Shingrix  Discontinued  Assessment/Plan:  This is a routine wellness examination for Torsha.  Patient Care Team: Donzella Lauraine SAILOR, DO as PCP - General (Family Medicine) Georgina Shasta POUR, RN as Oncology Nurse Navigator Pa, [facility] (Optometry) Cammie Evalene BIRCH, NP as Nurse Practitioner (Surgical Oncology)  I have personally reviewed and noted the following in the patients chart:   Medical and social history Use of alcohol, tobacco or illicit drugs  Current medications and supplements including opioid prescriptions. Functional ability and status Nutritional status Physical activity Advanced directives List of other physicians Hospitalizations, surgeries, and ER visits in previous 12 months Vitals Screenings to include cognitive, depression, and falls Referrals and appointments  Orders Placed This Encounter  Procedures   [facility] MAMMOGRAPHY    This external order was created through the Results Console.   In addition, I have reviewed and discussed with patient certain preventive protocols, quality metrics, and best practice recommendations. A written personalized care plan for preventive services as well as general preventive health recommendations were provided to patient.   Vina Ned, CMA   05/19/2024   Return in 1 year (on 05/24/2025) for Medicare Annual Wellness Visit.  After Visit  Summary: (Mail) Due to this being a telephonic visit, the after visit summary with patients personalized plan was offered to patient via mail   Nurse Notes:  6 CIT Score - 12 Requested most recent DM eye exam from [facility] (2025) Needs flu vaccine at next OV on 06/15/24 Needs Covid vaccine (pharmacy) Abstracted results of most recent MMG on 01/12/24 at [facility] (followed by oncology) Screening colonoscopy no longer recommended due to age.  Declined Tdap and shingles vaccines Declined DM & Nutrition education referral

## 2024-05-19 NOTE — Patient Instructions (Signed)
 Ms. Rambeau,  Thank you for taking the time for your Medicare Wellness Visit. I appreciate your continued commitment to your health goals. Please review the care plan we discussed, and feel free to reach out if I can assist you further.  Please note that Annual Wellness Visits do not include a physical exam. Some assessments may be limited, especially if the visit was conducted virtually. If needed, we may recommend an in-person follow-up with your provider.  Ongoing Care Seeing your primary care provider every 3 to 6 months helps us  monitor your health and provide consistent, personalized care.   Referrals If a referral was made during today's visit and you haven't received any updates within two weeks, please contact the referred provider directly to check on the status.  Recommended Screenings:  You may get the flu vaccine at your next OV on 06/15/24 with Dr. Donzella.  You may get the covid vaccine at your local pharmacy at your convenience.    Health Maintenance  Topic Date Due   COVID-19 Vaccine (1) Never done   Eye exam for diabetics  11/27/2023   Flu Shot  01/09/2024   Medicare Annual Wellness Visit  02/24/2024   Hemoglobin A1C  04/22/2024   Osteoporosis screening with Bone Density Scan  07/04/2024   Yearly kidney function blood test for diabetes  10/20/2024   Yearly kidney health urinalysis for diabetes  10/20/2024   Complete foot exam   10/20/2024   Breast Cancer Screening  01/11/2025   Pneumococcal Vaccine for age over 52  Completed   Hepatitis C Screening  Completed   Meningitis B Vaccine  Aged Out   DTaP/Tdap/Td vaccine  Discontinued   Zoster (Shingles) Vaccine  Discontinued       05/19/2024    2:07 PM  Advanced Directives  Does Patient Have a Medical Advance Directive? No  Would patient like information on creating a medical advance directive? No - Patient declined    Vision: Annual vision screenings are recommended for early detection of glaucoma, cataracts,  and diabetic retinopathy. These exams can also reveal signs of chronic conditions such as diabetes and high blood pressure.  Dental: Annual dental screenings help detect early signs of oral cancer, gum disease, and other conditions linked to overall health, including heart disease and diabetes.  Please see the attached documents for additional preventive care recommendations.

## 2024-06-15 ENCOUNTER — Ambulatory Visit: Admitting: Family Medicine

## 2024-06-15 ENCOUNTER — Encounter: Payer: Self-pay | Admitting: Family Medicine

## 2024-06-15 VITALS — BP 108/62 | HR 77 | Temp 98.3°F | Ht 63.0 in | Wt 146.3 lb

## 2024-06-15 DIAGNOSIS — E1122 Type 2 diabetes mellitus with diabetic chronic kidney disease: Secondary | ICD-10-CM

## 2024-06-15 DIAGNOSIS — Z7984 Long term (current) use of oral hypoglycemic drugs: Secondary | ICD-10-CM | POA: Diagnosis not present

## 2024-06-15 DIAGNOSIS — E1169 Type 2 diabetes mellitus with other specified complication: Secondary | ICD-10-CM | POA: Diagnosis not present

## 2024-06-15 DIAGNOSIS — R413 Other amnesia: Secondary | ICD-10-CM

## 2024-06-15 DIAGNOSIS — D649 Anemia, unspecified: Secondary | ICD-10-CM

## 2024-06-15 DIAGNOSIS — N182 Chronic kidney disease, stage 2 (mild): Secondary | ICD-10-CM | POA: Diagnosis not present

## 2024-06-15 DIAGNOSIS — I152 Hypertension secondary to endocrine disorders: Secondary | ICD-10-CM | POA: Diagnosis not present

## 2024-06-15 DIAGNOSIS — Z23 Encounter for immunization: Secondary | ICD-10-CM | POA: Diagnosis not present

## 2024-06-15 DIAGNOSIS — E782 Mixed hyperlipidemia: Secondary | ICD-10-CM

## 2024-06-15 DIAGNOSIS — E1159 Type 2 diabetes mellitus with other circulatory complications: Secondary | ICD-10-CM

## 2024-06-15 MED ORDER — MEMANTINE HCL 5 MG PO TABS: ORAL_TABLET | ORAL | 3 refills | Status: AC

## 2024-06-15 NOTE — Assessment & Plan Note (Signed)
 Chronic, well-managed with valsartan  20 mg daily and hydrochlorothiazide  12.5 mg daily. No acute concerns.  Continue to monitor.

## 2024-06-15 NOTE — Assessment & Plan Note (Signed)
 Chronic, managed with rosuvastatin  20 mg daily.  No acute concerns.  Recheck lipid panel today.

## 2024-06-15 NOTE — Assessment & Plan Note (Signed)
 Chronic, managed with Jardiance  25 mg daily, Januvia  100 mg daily and low carbohydrate diet.  Limited fluid intake, primarily bottled water and Gatorade Zero.  Patient not checking blood sugars due to inability to operate the glucometer; her daughter-in-law usually tries to do to support her but has not done so as often lately due to living elsewhere. - Ordered blood work as noted to assess current status. - Encouraged adequate fluid intake to support kidney function.

## 2024-06-15 NOTE — Assessment & Plan Note (Signed)
 Noted. No acute concerns.  Check CMP and uACR today.

## 2024-06-15 NOTE — Assessment & Plan Note (Signed)
 Significant memory loss affecting daily activities and medication adherence.  Significant difficulty with short-term memory.  6CIT score on 05/19/2024 was 12 (high). - Prescribed memantine , starting with one tablet daily for a week, then increase to one tablet twice daily. - Discussed potential side effects of memantine , including bowel changes, nausea, vomiting, headaches, and confusion. - Will consider alternative medication (donepezil) if memantine  is ineffective or only partially effective.

## 2024-06-15 NOTE — Progress Notes (Signed)
 "     Established patient visit   Patient: Vicki West   DOB: Oct 27, 1945   79 y.o. Female  MRN: [id number] Visit Date: 06/15/2024  Today's healthcare provider: LAURAINE LOISE BUOY, DO   Chief Complaint  Patient presents with   Medical Management of Chronic Issues    Patient is here today for a follow up on chronic management.  Expresses no concerns.  Diabetic Eye Exam - [facility] sent request for records.  Flu vaccine- yes   Subjective    HPI Vicki West is a 79 year old female who presents with memory changes.  She has been experiencing significant memory changes, including forgetting the purpose of her visits to the store and details of phone calls. She often cannot recall who called or the reason for the call shortly after it occurs. Her friend assists her in managing her medications, and she uses a pillbox to track her doses.  She is currently taking exemestane , status post excision of invasive mammary carcinoma, grade 2, ER 90%, PR 90%, HER2 negative.  , and rosuvastatin . She previously experienced body aches with anastrozole, which was subsequently changed.  No current issues with cough, chest pain, shortness of breath, lightheadedness, dizziness, vision changes, or headaches. She had a cold recently but reports that her cough has resolved.  Her dietary intake includes grits this morning, and she consumes water, Gatorade Zero, and occasionally Coke Zero. She has not been checking her blood sugar levels.  A bone density test was scheduled but canceled due to the doctor's unavailability; it has been rescheduled for February 2026. She is up to date with her mammograms.      Medications: Show/hide medication list[1]       Objective    BP 108/62 (BP Location: Left Arm, Patient Position: Sitting, Cuff Size: Normal)   Pulse 77   Temp 98.3 F (36.8 C) (Oral)   Ht 5' 3 (1.6 m)   Wt 146 lb 4.8 oz (66.4 kg)   SpO2 100%   BMI 25.92 kg/m     Physical  Exam Constitutional:      Appearance: Normal appearance.  HENT:     Head: Normocephalic and atraumatic.  Eyes:     General: No scleral icterus.    Extraocular Movements: Extraocular movements intact.     Conjunctiva/sclera: Conjunctivae normal.  Cardiovascular:     Rate and Rhythm: Normal rate and regular rhythm.     Pulses: Normal pulses.     Heart sounds: Normal heart sounds.  Pulmonary:     Effort: Pulmonary effort is normal. No respiratory distress.     Breath sounds: Normal breath sounds.  Musculoskeletal:     Right lower leg: No edema.     Left lower leg: No edema.  Skin:    General: Skin is warm and dry.  Neurological:     Mental Status: She is alert and oriented to person, place, and time. Mental status is at baseline.  Psychiatric:        Mood and Affect: Mood normal.        Behavior: Behavior normal.      No results found for any visits on 06/15/24.  Assessment & Plan    Memory changes Assessment & Plan: Significant memory loss affecting daily activities and medication adherence.  Significant difficulty with short-term memory.  6CIT score on 05/19/2024 was 12 (high). - Prescribed memantine , starting with one tablet daily for a week, then increase to one tablet twice daily. - Discussed  potential side effects of memantine , including bowel changes, nausea, vomiting, headaches, and confusion. - Will consider alternative medication (donepezil) if memantine  is ineffective or only partially effective.   Orders: -     Vitamin B12 -     Memantine  HCl; Take one tablet daily for one week, then increase to two tablets daily.  Dispense: 60 tablet; Refill: 3  Type 2 diabetes mellitus with stage 2 chronic kidney disease, without long-term current use of insulin (HCC) Assessment & Plan: Chronic, managed with Jardiance  25 mg daily, Januvia  100 mg daily and low carbohydrate diet.  Limited fluid intake, primarily bottled water and Gatorade Zero.  Patient not checking blood sugars  due to inability to operate the glucometer; her daughter-in-law usually tries to do to support her but has not done so as often lately due to living elsewhere. - Ordered blood work as noted to assess current status. - Encouraged adequate fluid intake to support kidney function.  Orders: -     Hemoglobin A1c -     Microalbumin / creatinine urine ratio  Chronic kidney disease (CKD) stage G2/A2, mildly decreased glomerular filtration rate (GFR) between 60-89 mL/min/1.73 square meter and albuminuria creatinine ratio between 30-299 mg/g Assessment & Plan: Noted. No acute concerns.  Check CMP and uACR today.      Orders: -     CBC with Differential/Platelet -     Hemoglobin A1c -     Microalbumin / creatinine urine ratio  Hypertension associated with type 2 diabetes mellitus (HCC) Assessment & Plan: Chronic, well-managed with valsartan  20 mg daily and hydrochlorothiazide  12.5 mg daily. No acute concerns.  Continue to monitor.   Orders: -     Comprehensive metabolic panel with GFR -     Lipid Panel With LDL/HDL Ratio  Mixed hyperlipidemia due to type 2 diabetes mellitus (HCC) Assessment & Plan: Chronic, managed with rosuvastatin  20 mg daily.  No acute concerns.  Recheck lipid panel today.  Orders: -     Lipid Panel With LDL/HDL Ratio  Normocytic anemia -     CBC with Differential/Platelet  Needs flu shot -     Flu vaccine HIGH DOSE PF(Fluzone Trivalent)    General Health Maintenance Due for flu vaccination and COVID booster. Up to date on mammogram and DEXA scan scheduled. - Administered flu shot today. - Recommended COVID booster at pharmacy. - Ensured mammogram is up to date - planned for August 2026. - Confirmed DEXA scan is scheduled with [facility] oncology for February 2026.    Return in about 5 weeks (around 07/20/2024) for Memory, and in 3 months (from that visit) for DM/HTN/Chronic f/u w/Dr. Sharma.      I discussed the assessment and treatment plan with the  patient  The patient was provided an opportunity to ask questions and all were answered. The patient agreed with the plan and demonstrated an understanding of the instructions.   The patient was advised to call back or seek an in-person evaluation if the symptoms worsen or if the condition fails to improve as anticipated.    LAURAINE LOISE BUOY, DO  [facility] [phone] (phone) [phone] (fax)  [facility]    [1]  Outpatient Medications Prior to Visit  Medication Sig   Blood Glucose Monitoring Suppl DEVI 1 each by Does not apply route in the morning, at noon, and at bedtime. May substitute to any manufacturer covered by patient's insurance.   calcium  carbonate (TUMS - DOSED IN MG ELEMENTAL CALCIUM )  500 MG chewable tablet Chew 1 tablet by mouth daily.   empagliflozin  (JARDIANCE ) 25 MG TABS tablet Take 1 tablet (25 mg total) by mouth daily before breakfast.   exemestane  (AROMASIN ) 25 MG tablet Take 1 tablet (25 mg total) by mouth daily after breakfast.   hydrochlorothiazide  (HYDRODIURIL ) 12.5 MG tablet Take 1 tablet (12.5 mg total) by mouth daily.   rosuvastatin  (CRESTOR ) 20 MG tablet Take 1 tablet (20 mg total) by mouth daily.   sitaGLIPtin  (JANUVIA ) 100 MG tablet Take 1 tablet (100 mg total) by mouth daily.   valsartan  (DIOVAN ) 40 MG tablet Take 0.5 tablets (20 mg total) by mouth daily.   VITAMIN D , CHOLECALCIFEROL, PO Take by mouth.   [DISCONTINUED] albuterol  (VENTOLIN  HFA) 108 (90 Base) MCG/ACT inhaler Inhale 1-2 puffs into the lungs every 6 (six) hours as needed for shortness of breath. (Patient not taking: Reported on 05/19/2024)   [DISCONTINUED] Respiratory Therapy Supplies (FULL KIT NEBULIZER SET) MISC 1 each by Does not apply route once for 1 dose. (Patient not taking: Reported on 05/19/2024)   [DISCONTINUED] Spacer/Aero-Holding Raguel FRENCH Use in combination with albuterol  inhaler, following the instructions for the albuterol  (Patient  not taking: Reported on 05/19/2024)   No facility-administered medications prior to visit.   "

## 2024-06-16 LAB — CBC WITH DIFFERENTIAL/PLATELET
Basophils Absolute: 0 x10E3/uL (ref 0.0–0.2)
Basos: 0 %
EOS (ABSOLUTE): 0.1 x10E3/uL (ref 0.0–0.4)
Eos: 2 %
Hematocrit: 38.4 % (ref 34.0–46.6)
Hemoglobin: 12 g/dL (ref 11.1–15.9)
Immature Grans (Abs): 0 x10E3/uL (ref 0.0–0.1)
Immature Granulocytes: 0 %
Lymphocytes Absolute: 2.4 x10E3/uL (ref 0.7–3.1)
Lymphs: 38 %
MCH: 29.9 pg (ref 26.6–33.0)
MCHC: 31.3 g/dL — ABNORMAL LOW (ref 31.5–35.7)
MCV: 96 fL (ref 79–97)
Monocytes Absolute: 0.5 x10E3/uL (ref 0.1–0.9)
Monocytes: 7 %
Neutrophils Absolute: 3.4 x10E3/uL (ref 1.4–7.0)
Neutrophils: 53 %
Platelets: 246 x10E3/uL (ref 150–450)
RBC: 4.02 x10E6/uL (ref 3.77–5.28)
RDW: 13.3 % (ref 11.7–15.4)
WBC: 6.5 x10E3/uL (ref 3.4–10.8)

## 2024-06-16 LAB — LIPID PANEL WITH LDL/HDL RATIO
Cholesterol, Total: 172 mg/dL (ref 100–199)
HDL: 70 mg/dL
LDL Chol Calc (NIH): 89 mg/dL (ref 0–99)
LDL/HDL Ratio: 1.3 ratio (ref 0.0–3.2)
Triglycerides: 67 mg/dL (ref 0–149)
VLDL Cholesterol Cal: 13 mg/dL (ref 5–40)

## 2024-06-16 LAB — COMPREHENSIVE METABOLIC PANEL WITH GFR
ALT: 17 IU/L (ref 0–32)
AST: 22 IU/L (ref 0–40)
Albumin: 4.8 g/dL (ref 3.8–4.8)
Alkaline Phosphatase: 85 IU/L (ref 49–135)
BUN/Creatinine Ratio: 16 (ref 12–28)
BUN: 18 mg/dL (ref 8–27)
Bilirubin Total: 0.3 mg/dL (ref 0.0–1.2)
CO2: 21 mmol/L (ref 20–29)
Calcium: 10 mg/dL (ref 8.7–10.3)
Chloride: 100 mmol/L (ref 96–106)
Creatinine, Ser: 1.11 mg/dL — ABNORMAL HIGH (ref 0.57–1.00)
Globulin, Total: 3.3 g/dL (ref 1.5–4.5)
Glucose: 207 mg/dL — ABNORMAL HIGH (ref 70–99)
Potassium: 4.1 mmol/L (ref 3.5–5.2)
Sodium: 141 mmol/L (ref 134–144)
Total Protein: 8.1 g/dL (ref 6.0–8.5)
eGFR: 51 mL/min/1.73 — ABNORMAL LOW

## 2024-06-16 LAB — HEMOGLOBIN A1C
Est. average glucose Bld gHb Est-mCnc: 220 mg/dL
Hgb A1c MFr Bld: 9.3 % — ABNORMAL HIGH (ref 4.8–5.6)

## 2024-06-16 LAB — MICROALBUMIN / CREATININE URINE RATIO
Creatinine, Urine: 43.4 mg/dL
Microalb/Creat Ratio: 16 mg/g{creat} (ref 0–29)
Microalbumin, Urine: 6.8 ug/mL

## 2024-06-16 LAB — VITAMIN B12: Vitamin B-12: 656 pg/mL (ref 232–1245)

## 2024-06-23 ENCOUNTER — Other Ambulatory Visit: Payer: Self-pay | Admitting: Family Medicine

## 2024-06-23 DIAGNOSIS — E1122 Type 2 diabetes mellitus with diabetic chronic kidney disease: Secondary | ICD-10-CM

## 2024-06-23 DIAGNOSIS — E1169 Type 2 diabetes mellitus with other specified complication: Secondary | ICD-10-CM

## 2024-07-06 ENCOUNTER — Ambulatory Visit: Payer: Self-pay | Admitting: Family Medicine

## 2024-07-20 ENCOUNTER — Ambulatory Visit: Admitting: Family Medicine

## 2024-09-21 ENCOUNTER — Encounter: Admitting: Family Medicine

## 2025-05-24 ENCOUNTER — Ambulatory Visit
